# Patient Record
Sex: Female | Born: 1935 | Race: White | Hispanic: No | Marital: Single | Smoking: Never smoker
Health system: Southern US, Community
[De-identification: ages and names within clinical notes are randomized; demographics above are authoritative.]

## PROBLEM LIST (undated history)

## (undated) DIAGNOSIS — I10 Essential (primary) hypertension: Secondary | ICD-10-CM

## (undated) DIAGNOSIS — M8580 Other specified disorders of bone density and structure, unspecified site: Secondary | ICD-10-CM

## (undated) DIAGNOSIS — M898X9 Other specified disorders of bone, unspecified site: Secondary | ICD-10-CM

## (undated) DIAGNOSIS — M1990 Unspecified osteoarthritis, unspecified site: Secondary | ICD-10-CM

## (undated) HISTORY — PX: EYE SURGERY: SHX253

## (undated) HISTORY — PX: TONSILLECTOMY: SUR1361

## (undated) HISTORY — DX: Other specified disorders of bone, unspecified site: M89.8X9

## (undated) HISTORY — DX: Other specified disorders of bone density and structure, unspecified site: M85.80

---

## 2004-07-11 ENCOUNTER — Ambulatory Visit: Payer: Self-pay | Admitting: Internal Medicine

## 2005-09-28 ENCOUNTER — Ambulatory Visit: Payer: Self-pay | Admitting: Gastroenterology

## 2005-11-21 ENCOUNTER — Ambulatory Visit: Payer: Self-pay | Admitting: Internal Medicine

## 2006-11-27 ENCOUNTER — Ambulatory Visit: Payer: Self-pay | Admitting: Internal Medicine

## 2007-06-09 ENCOUNTER — Ambulatory Visit: Payer: Self-pay | Admitting: Physician Assistant

## 2007-08-06 ENCOUNTER — Ambulatory Visit: Payer: Self-pay | Admitting: Gastroenterology

## 2008-08-15 ENCOUNTER — Emergency Department: Payer: Self-pay | Admitting: Emergency Medicine

## 2008-12-21 ENCOUNTER — Ambulatory Visit: Payer: Self-pay | Admitting: Gastroenterology

## 2009-03-22 ENCOUNTER — Encounter: Payer: Self-pay | Admitting: Internal Medicine

## 2009-03-24 ENCOUNTER — Ambulatory Visit: Payer: Self-pay | Admitting: Internal Medicine

## 2009-04-13 ENCOUNTER — Encounter: Payer: Self-pay | Admitting: Internal Medicine

## 2009-05-14 ENCOUNTER — Encounter: Payer: Self-pay | Admitting: Internal Medicine

## 2010-04-27 ENCOUNTER — Ambulatory Visit: Payer: Self-pay | Admitting: Internal Medicine

## 2010-05-09 ENCOUNTER — Encounter: Payer: Self-pay | Admitting: Sports Medicine

## 2010-05-14 ENCOUNTER — Encounter: Payer: Self-pay | Admitting: Sports Medicine

## 2010-06-14 ENCOUNTER — Encounter: Payer: Self-pay | Admitting: Sports Medicine

## 2011-02-09 ENCOUNTER — Ambulatory Visit: Payer: Self-pay | Admitting: Ophthalmology

## 2011-02-19 ENCOUNTER — Ambulatory Visit: Payer: Self-pay | Admitting: Ophthalmology

## 2011-06-22 ENCOUNTER — Ambulatory Visit: Payer: Self-pay | Admitting: Gastroenterology

## 2013-03-14 IMAGING — RF DG UGI W/ KUB
3 series · 14 of 24 positions shown · non-contrast
Comparison: none

REASON FOR EXAM: with 12 mm tablet  unspecified dysphagia
gastroesophageal reflux
COMMENTS:

PROCEDURE:     FL  - FL UPPER GI W/ BARIUM SWALLOW  - June 22, 2011  [DATE]
RESULT:     Comparison: None.
TECHNIQUE: Standard double contrast upper GI and barium swallow was
performed with monitoring by intermittent fluoroscopy.

[Series 1: fluoro_barium 2fps_bw · 0.17mm/px · 2 of 15 frames shown (1 of 3)]
[frame 3/15]
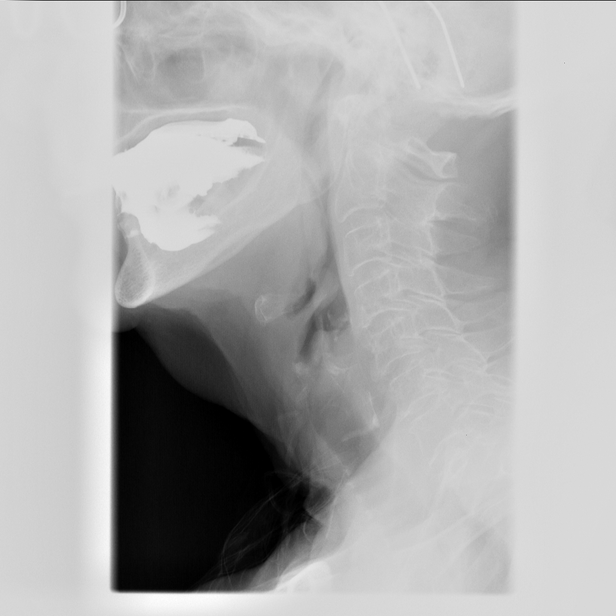
[frame 10/15]
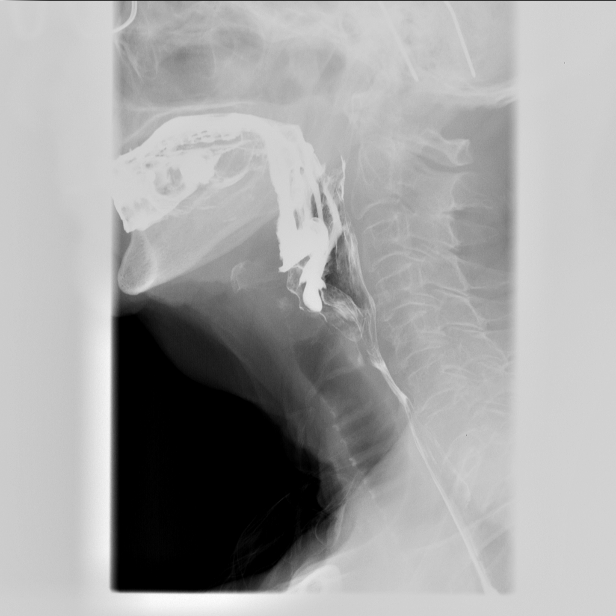

[Series 2: fluoro_barium 2fps_bw · 0.17mm/px · 2 of 10 frames shown (2 of 3)]
[frame 2/10]
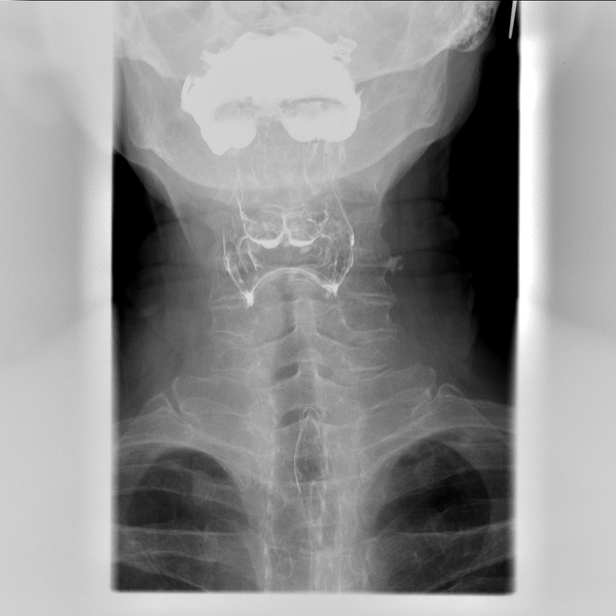
[frame 9/10]
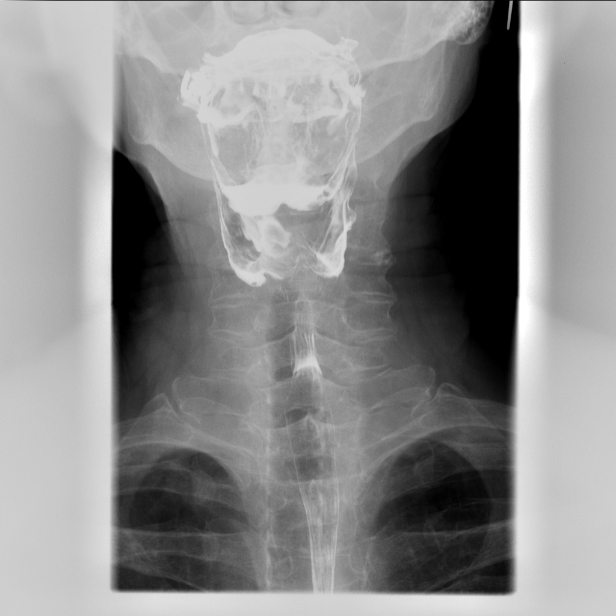

[Series 3: fluoro_barium 2fps_bw · 0.17mm/px · 10 of 18 slices shown (3 of 3)]
[im 1/18]
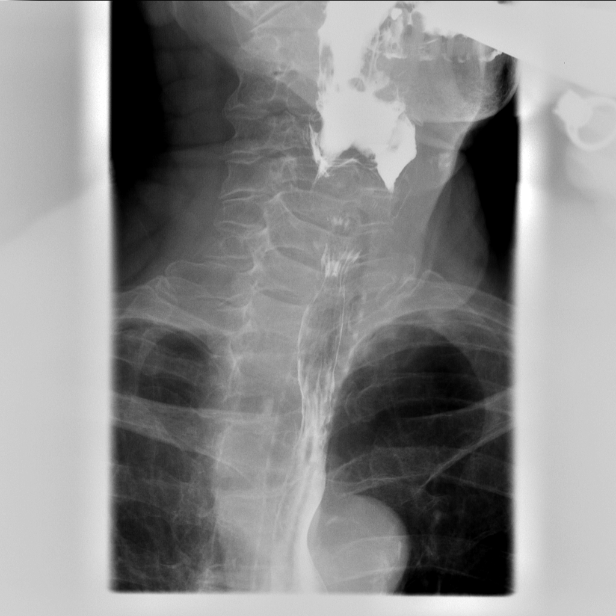
[im 3/18]
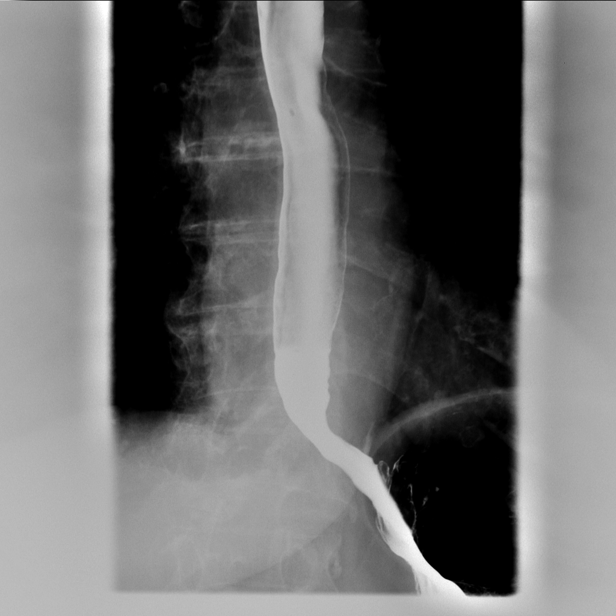
[im 5/18]
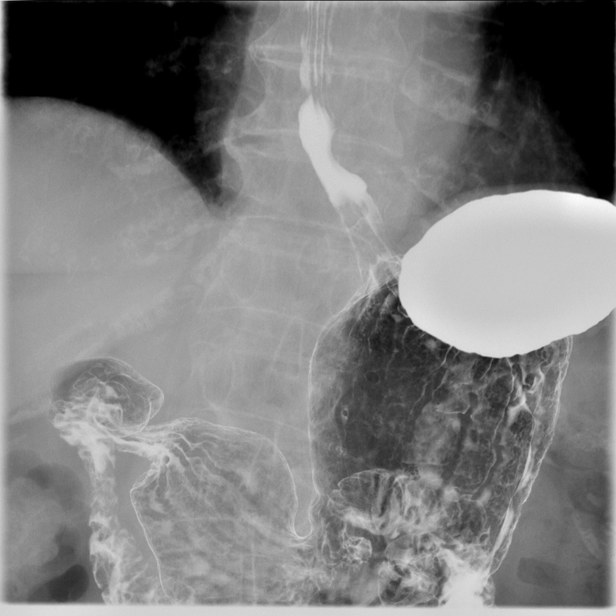
[im 6/18]
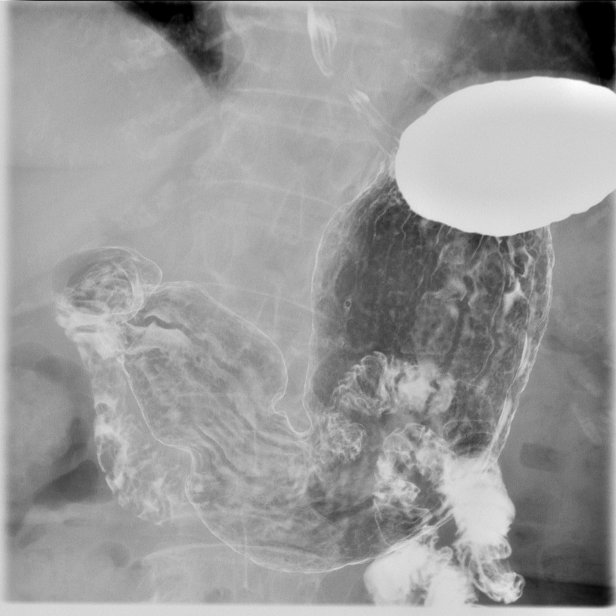
[im 8/18]
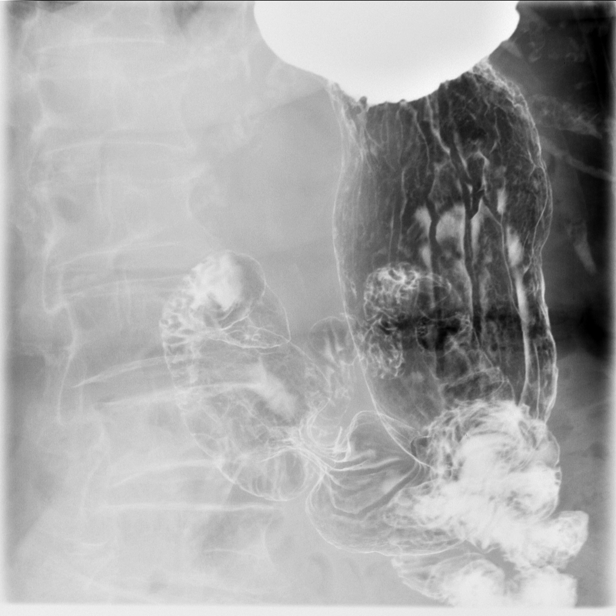
[im 10/18]
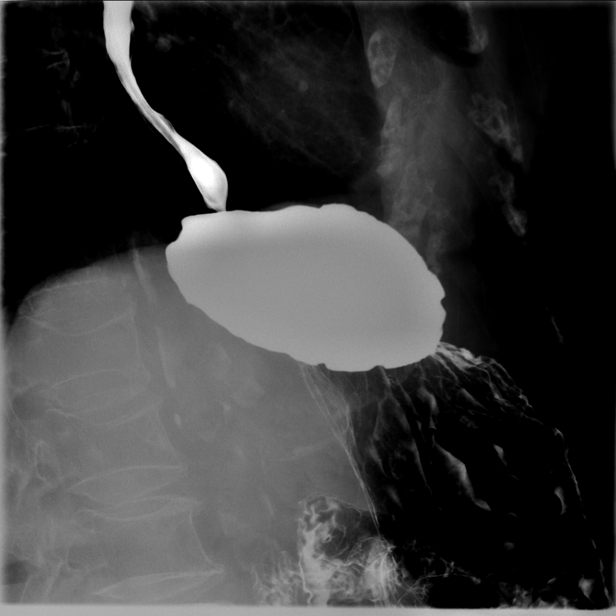
[im 13/18]
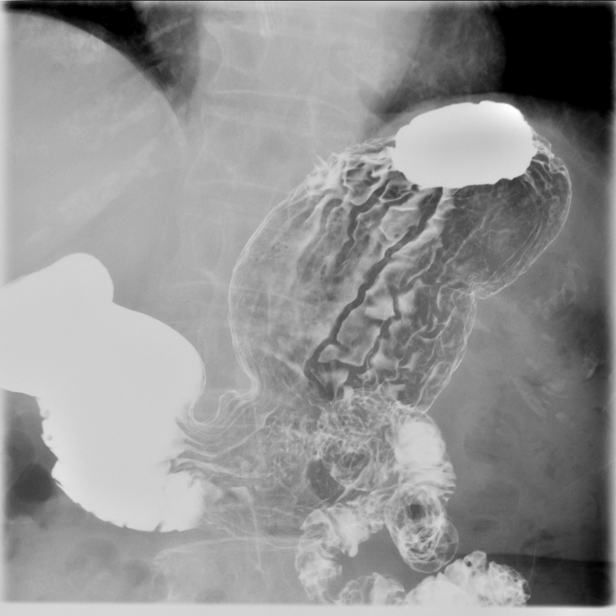
[im 14/18]
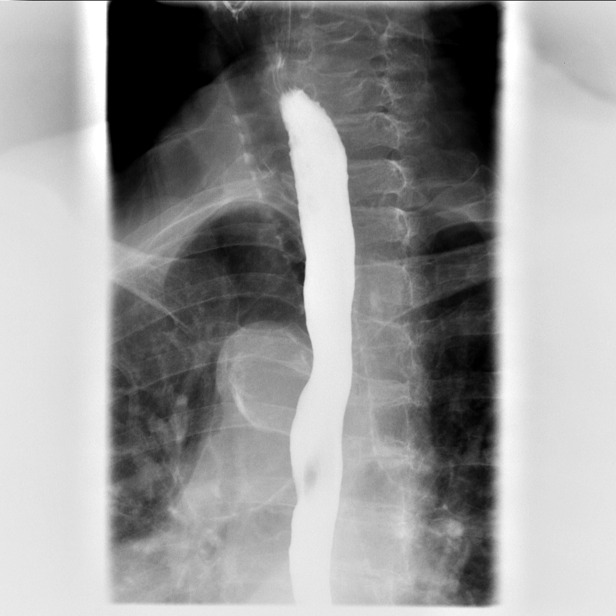
[im 16/18]
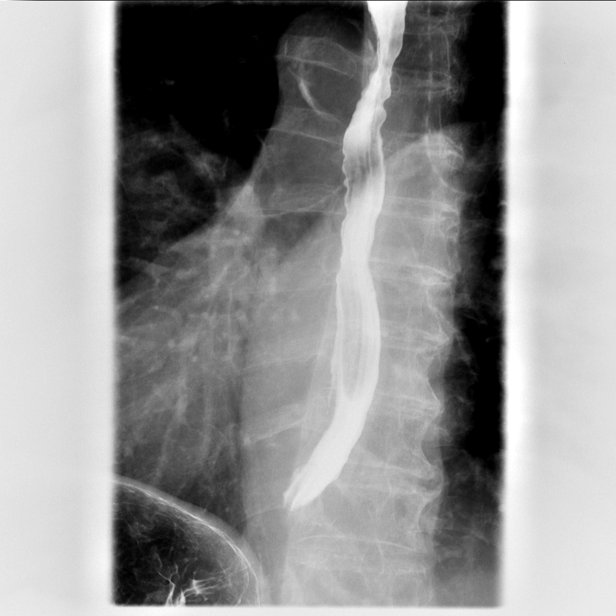
[im 18/18]
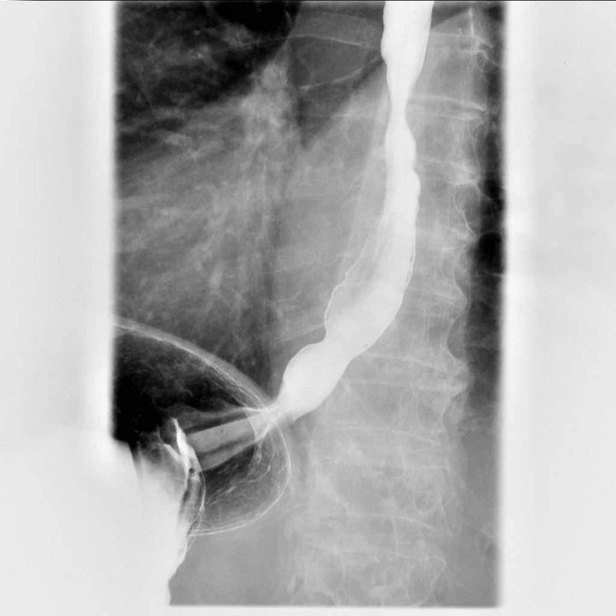

[14 of 24 positions shown; findings below may reference images not displayed]

FINDINGS: There is normal passage of barium contrast material through the hypopharynx
and into the cervical esophagus. No penetration or aspiration.

The thoracic esophagus was normal in caliber. No stricture or intraluminal
filling defect identified. There was an intermittent small sliding-type
hiatal hernia. There was mild gastroesophageal reflux into the distal
esophagus during examination. Gastroesophageal junction was normal in
caliber. There is a break in the primary peristaltic wave with a few
secondary contractions.

The stomach was normal in contour. No ulceration or intraluminal filling
defects identified.

The patient swallowed a 13 mm barium tablet which passed freely into the
stomach.
IMPRESSION: 1. Small, intermittent sliding type hiatal hernia.
2. Mild gastroesophageal reflux.

## 2013-06-17 ENCOUNTER — Encounter: Payer: Self-pay | Admitting: Podiatry

## 2013-06-17 ENCOUNTER — Ambulatory Visit (INDEPENDENT_AMBULATORY_CARE_PROVIDER_SITE_OTHER): Payer: Medicare Other | Admitting: Podiatry

## 2013-06-17 ENCOUNTER — Ambulatory Visit (INDEPENDENT_AMBULATORY_CARE_PROVIDER_SITE_OTHER): Payer: Medicare Other

## 2013-06-17 VITALS — BP 146/64 | HR 70 | Resp 16 | Ht 64.0 in | Wt 130.0 lb

## 2013-06-17 DIAGNOSIS — M79673 Pain in unspecified foot: Secondary | ICD-10-CM

## 2013-06-17 DIAGNOSIS — M79609 Pain in unspecified limb: Secondary | ICD-10-CM

## 2013-06-17 DIAGNOSIS — M7750 Other enthesopathy of unspecified foot: Secondary | ICD-10-CM

## 2013-06-17 DIAGNOSIS — M2040 Other hammer toe(s) (acquired), unspecified foot: Secondary | ICD-10-CM

## 2013-06-17 DIAGNOSIS — M7752 Other enthesopathy of left foot: Secondary | ICD-10-CM

## 2013-06-17 NOTE — Progress Notes (Signed)
   Subjective:    Patient ID: Erika Daniel, female    DOB: 1921/12/28, 80 y.o.   MRN: [id number]  HPI Comments: i have a corn on my little toe on my left foot and it does not hurt. It will hurt with shoes on and ive had it for 1.5 to 2.0 years. Its grown some over the years and i dont do anything for it.   Foot Pain      Review of Systems  All other systems reviewed and are negative.       Objective:   Physical Exam: I have reviewed her past mental history medications allergies surgeries and social history. Vital signs are stable she is alert and oriented x3. Pulses are palpable left foot. Neurologic sensorium is intact left foot. She has hammertoe deformities 2 through 5 of the left foot. Overlying bursitis PIPJ fifth digit left foot with a reactive hyperkeratosis and a porokeratotic lesion. This is demonstrated by radiographs also.          Assessment & Plan:  Assessment: Hammertoe deformity fifth left with overlying bursitis and reactive hyperkeratosis.  Plan: Injected dexamethasone and local anesthetic sublesionally today into the bursa and about the capsule. 2 mg was utilized. I debrided the reactive hyperkeratosis placed a Band-Aid she'll start soaking in Epsom salts warm water she we'll have her shoes by stretch we discussed this in detail today and I will followup with her on an as-needed basis.

## 2014-03-22 DIAGNOSIS — Z8739 Personal history of other diseases of the musculoskeletal system and connective tissue: Secondary | ICD-10-CM | POA: Insufficient documentation

## 2014-03-22 DIAGNOSIS — K219 Gastro-esophageal reflux disease without esophagitis: Secondary | ICD-10-CM | POA: Insufficient documentation

## 2014-03-22 DIAGNOSIS — I10 Essential (primary) hypertension: Secondary | ICD-10-CM | POA: Insufficient documentation

## 2014-03-24 DIAGNOSIS — E559 Vitamin D deficiency, unspecified: Secondary | ICD-10-CM | POA: Insufficient documentation

## 2014-05-30 ENCOUNTER — Emergency Department: Payer: Self-pay | Admitting: Emergency Medicine

## 2014-05-30 LAB — URINALYSIS, COMPLETE
BLOOD: NEGATIVE
Bilirubin,UR: NEGATIVE
Glucose,UR: NEGATIVE mg/dL (ref 0–75)
KETONE: NEGATIVE
LEUKOCYTE ESTERASE: NEGATIVE
Nitrite: NEGATIVE
Ph: 7 (ref 4.5–8.0)
Protein: NEGATIVE
Specific Gravity: 1.004 (ref 1.003–1.030)
Squamous Epithelial: <1
WBC UR: 6 /HPF (ref 0–5)

## 2014-05-30 LAB — BASIC METABOLIC PANEL
ANION GAP: 7 (ref 7–16)
BUN: 17 mg/dL (ref 7–18)
CO2: 29 mmol/L (ref 21–32)
Calcium, Total: 8.7 mg/dL (ref 8.5–10.1)
Chloride: 103 mmol/L (ref 98–107)
Creatinine: 1.08 mg/dL (ref 0.60–1.30)
EGFR (African American): >60
EGFR (Non-African Amer.): 51 — ABNORMAL LOW
Glucose: 82 mg/dL (ref 65–99)
OSMOLALITY: 278 (ref 275–301)
Potassium: 4.5 mmol/L (ref 3.5–5.1)
Sodium: 139 mmol/L (ref 136–145)

## 2014-05-30 LAB — CBC
HCT: 41.2 % (ref 35.0–47.0)
HGB: 13.5 g/dL (ref 12.0–16.0)
MCH: 30.6 pg (ref 26.0–34.0)
MCHC: 32.9 g/dL (ref 32.0–36.0)
MCV: 93 fL (ref 80–100)
Platelet: 260 10*3/uL (ref 150–440)
RBC: 4.42 10*6/uL (ref 3.80–5.20)
RDW: 13.7 % (ref 11.5–14.5)
WBC: 6.3 10*3/uL (ref 3.6–11.0)

## 2014-05-30 LAB — TROPONIN I

## 2014-06-08 ENCOUNTER — Encounter: Payer: Self-pay | Admitting: Family Medicine

## 2014-06-14 ENCOUNTER — Encounter: Payer: Self-pay | Admitting: Family Medicine

## 2014-07-13 ENCOUNTER — Encounter: Payer: Self-pay | Admitting: Family Medicine

## 2015-01-07 ENCOUNTER — Emergency Department: Payer: Medicare Other

## 2015-01-07 ENCOUNTER — Emergency Department
Admission: EM | Admit: 2015-01-07 | Discharge: 2015-01-07 | Disposition: A | Discharge status: Home / Self Care | Payer: Medicare Other | Attending: Emergency Medicine | Admitting: Emergency Medicine

## 2015-01-07 DIAGNOSIS — R001 Bradycardia, unspecified: Secondary | ICD-10-CM | POA: Diagnosis not present

## 2015-01-07 DIAGNOSIS — R531 Weakness: Secondary | ICD-10-CM | POA: Diagnosis not present

## 2015-01-07 LAB — COMPREHENSIVE METABOLIC PANEL
ALK PHOS: 86 U/L (ref 38–126)
ALT: 43 U/L (ref 14–54)
AST: 82 U/L — AB (ref 15–41)
Albumin: 3.9 g/dL (ref 3.5–5.0)
Anion gap: 8 (ref 5–15)
BILIRUBIN TOTAL: 1.5 mg/dL — AB (ref 0.3–1.2)
BUN: 12 mg/dL (ref 6–20)
CO2: 25 mmol/L (ref 22–32)
CREATININE: 0.93 mg/dL (ref 0.44–1.00)
Calcium: 8.9 mg/dL (ref 8.9–10.3)
Chloride: 104 mmol/L (ref 101–111)
GFR calc Af Amer: >60 mL/min (ref >60)
GFR, EST NON AFRICAN AMERICAN: 52 mL/min — AB (ref >60)
GLUCOSE: 128 mg/dL — AB (ref 65–99)
Potassium: 4 mmol/L (ref 3.5–5.1)
Sodium: 137 mmol/L (ref 135–145)
TOTAL PROTEIN: 6.3 g/dL — AB (ref 6.5–8.1)

## 2015-01-07 LAB — TROPONIN I: Troponin I: <0.03 ng/mL (ref <0.031)

## 2015-01-07 LAB — CBC
HEMATOCRIT: 40.4 % (ref 35.0–47.0)
HEMOGLOBIN: 13.5 g/dL (ref 12.0–16.0)
MCH: 30.6 pg (ref 26.0–34.0)
MCHC: 33.5 g/dL (ref 32.0–36.0)
MCV: 91.4 fL (ref 80.0–100.0)
PLATELETS: 253 10*3/uL (ref 150–440)
RBC: 4.41 MIL/uL (ref 3.80–5.20)
RDW: 13.3 % (ref 11.5–14.5)
WBC: 8.4 10*3/uL (ref 3.6–11.0)

## 2015-01-07 MED ORDER — METOPROLOL TARTRATE 25 MG PO TABS: 25.0000 mg | ORAL_TABLET | Freq: Two times a day (BID) | ORAL | Status: DC

## 2015-01-07 NOTE — ED Provider Notes (Signed)
[facility] Emergency Department Provider Note     Time seen: ----------------------------------------- 1:27 PM on 01/07/2015 -----------------------------------------    I have reviewed the triage vital signs and the nursing notes.   HISTORY  Chief Complaint No chief complaint on file.    HPI Erika Daniel is a 81 y.o. female who presents to ER after having a sudden onset of weakness today. Patient states she felt lifeless, on EMS arrival she was noted to be bradycardic, family had reported a low blood pressure at home. EMS found a normal blood pressure. Patient does not describe any change in her medications or the possibility that she could've taken too much for medicine. Patient states she had a virus over the weekend but otherwise has not been ill recently. Currently feeling better, she received half liter of saline in route.   Past Medical History  Diagnosis Date  . Bone loss     There are no active problems to display for this patient.   Past Surgical History  Procedure Laterality Date  . Eye surgery Right     implant  . Tonsillectomy      Allergies Codeine  Social History Social History  Substance Use Topics  . Smoking status: Never Smoker   . Smokeless tobacco: Not on file  . Alcohol Use: No    Review of Systems Constitutional: Negative for fever. Eyes: Negative for visual changes. ENT: Negative for sore throat. Cardiovascular: Negative for chest pain. Respiratory: Negative for shortness of breath. Gastrointestinal: Negative for abdominal pain, vomiting and diarrhea. Genitourinary: Negative for dysuria. Musculoskeletal: Negative for back pain. Skin: Negative for rash. Neurological: Positive for weakness  10-point ROS otherwise negative.  ____________________________________________   PHYSICAL EXAM:  VITAL SIGNS: ED Triage Vitals  Enc Vitals Group     BP --      Pulse --      Resp --      Temp --      Temp  src --      SpO2 --      Weight --      Height --      Head Cir --      Peak Flow --      Pain Score --      Pain Loc --      Pain Edu? --      Excl. in [place]? --     Constitutional: Alert and oriented. Well appearing and in no distress. Eyes: Conjunctivae are normal. PERRL. Normal extraocular movements. ENT   Head: Normocephalic and atraumatic.   Nose: No congestion/rhinnorhea.   Mouth/Throat: Mucous membranes are moist.   Neck: No stridor. Cardiovascular: Normal rate, regular rhythm. Normal and symmetric distal pulses are present in all extremities. No murmurs, rubs, or gallops. Respiratory: Normal respiratory effort without tachypnea nor retractions. Breath sounds are clear and equal bilaterally. No wheezes/rales/rhonchi. Gastrointestinal: Soft and nontender. No distention. No abdominal bruits.  Musculoskeletal: Nontender with normal range of motion in all extremities. No joint effusions.  No lower extremity tenderness nor edema. Neurologic:  Normal speech and language. No gross focal neurologic deficits are appreciated. Speech is normal. No gait instability. Skin:  Skin is warm, dry and intact. No rash noted. Psychiatric: Mood and affect are normal. Speech and behavior are normal. Patient exhibits appropriate insight and judgment. ____________________________________________  EKG: Interpreted by me. Sinus bradycardia with first-degree AV block, normal QS with, normal QT interval. Normal axis.  ____________________________________________  ED COURSE:  Pertinent labs &  imaging results that were available during my care of the patient were reviewed by me and considered in my medical decision making (see chart for details). Patient will need cardiac labs, EKG and reevaluation. ____________________________________________    LABS (pertinent positives/negatives)  Labs Reviewed  COMPREHENSIVE METABOLIC PANEL - Abnormal; Notable for the following:    Glucose, Bld 128 (*)     Total Protein 6.3 (*)    AST 82 (*)    Total Bilirubin 1.5 (*)    GFR calc non Af Amer 52 (*)    All other components within normal limits  CBC  TROPONIN I  URINALYSIS COMPLETEWITH MICROSCOPIC ([facility] ONLY)    RADIOLOGY Images were viewed by me  chest x-ray IMPRESSION: No acute cardiopulmonary disease. ____________________________________________  FINAL ASSESSMENT AND PLAN  Weakness, bradycardia  Plan: Patient with labs and imaging as dictated above. Patient does not was stay in the hospital. Attempted to discuss with her primary care doctor however was unable. We will shorten her metoprolol dose to half and have her follow-up in 2 days for recheck. She is encouraged to return for worsening or worrisome symptoms.   Earleen Newport, MD   Earleen Newport, MD 01/07/15 [phone]

## 2015-01-07 NOTE — ED Notes (Signed)
Ems from home for sudden onset weakness

## 2015-01-07 NOTE — Discharge Instructions (Signed)
Bradycardia Bradycardia is a term for a heart rate (pulse) that, in adults, is slower than 60 beats per minute. A normal rate is 60 to 100 beats per minute. A heart rate below 60 beats per minute may be normal for some adults with healthy hearts. If the rate is too slow, the heart may have trouble pumping the volume of blood the body needs. If the heart rate gets too low, blood flow to the brain may be decreased and may make you feel lightheaded, dizzy, or faint. The heart has a natural pacemaker in the top of the heart called the SA node (sinoatrial or sinus node). This pacemaker sends out regular electrical signals to the muscle of the heart, telling the heart muscle when to beat (contract). The electrical signal travels from the upper parts of the heart (atria) through the AV node (atrioventricular node), to the lower chambers of the heart (ventricles). The ventricles squeeze, pumping the blood from your heart to your lungs and to the rest of your body. CAUSES   Problem with the heart's electrical system.  Problem with the heart's natural pacemaker.  Heart disease, damage, or infection.  Medications.  Problems with minerals and salts (electrolytes). SYMPTOMS   Fainting (syncope).  Fatigue and weakness.  Shortness of breath (dyspnea).  Chest pain (angina).  Drowsiness.  Confusion. DIAGNOSIS   An electrocardiogram (ECG) can help your caregiver determine the type of slow heart rate you have.  If the cause is not seen on an ECG, you may need to wear a heart monitor that records your heart rhythm for several hours or days.  Blood tests. TREATMENT   Electrolyte supplements.  Medications.  Withholding medication which is causing a slow heart rate.  Pacemaker placement. SEEK IMMEDIATE MEDICAL CARE IF:   You feel lightheaded or faint.  You develop an irregular heart rate.  You feel chest pain or have trouble breathing. MAKE SURE YOU:   Understand these  instructions.  Will watch your condition.  Will get help right away if you are not doing well or get worse. Document Released: 01/20/2002 Document Revised: 07/23/2011 Document Reviewed: 08/05/2013 [facility] Patient Information 2015 [place], [place]. This information is not intended to replace advice given to you by your health care provider. Make sure you discuss any questions you have with your health care provider.  Weakness Weakness is a lack of strength. It may be felt all over the body (generalized) or in one specific part of the body (focal). Some causes of weakness can be serious. You may need further medical evaluation, especially if you are elderly or you have a history of immunosuppression (such as chemotherapy or HIV), kidney disease, heart disease, or diabetes. CAUSES  Weakness can be caused by many different things, including:  Infection.  Physical exhaustion.  Internal bleeding or other blood loss that results in a lack of red blood cells (anemia).  Dehydration. This cause is more common in elderly people.  Side effects or electrolyte abnormalities from medicines, such as pain medicines or sedatives.  Emotional distress, anxiety, or depression.  Circulation problems, especially severe peripheral arterial disease.  Heart disease, such as rapid atrial fibrillation, bradycardia, or heart failure.  Nervous system disorders, such as Guillain-Barr syndrome, multiple sclerosis, or stroke. DIAGNOSIS  To find the cause of your weakness, your caregiver will take your history and perform a physical exam. Lab tests or X-rays may also be ordered, if needed. TREATMENT  Treatment of weakness depends on the cause of your symptoms and  can vary greatly. HOME CARE INSTRUCTIONS   Rest as needed.  Eat a well-balanced diet.  Try to get some exercise every day.  Only take over-the-counter or prescription medicines as directed by your caregiver. SEEK MEDICAL CARE IF:   Your weakness  seems to be getting worse or spreads to other parts of your body.  You develop new aches or pains. SEEK IMMEDIATE MEDICAL CARE IF:   You cannot perform your normal daily activities, such as getting dressed and feeding yourself.  You cannot walk up and down stairs, or you feel exhausted when you do so.  You have shortness of breath or chest pain.  You have difficulty moving parts of your body.  You have weakness in only one area of the body or on only one side of the body.  You have a fever.  You have trouble speaking or swallowing.  You cannot control your bladder or bowel movements.  You have black or bloody vomit or stools. MAKE SURE YOU:  Understand these instructions.  Will watch your condition.  Will get help right away if you are not doing well or get worse. Document Released: 04/30/2005 Document Revised: 10/30/2011 Document Reviewed: 06/29/2011 [facility] Patient Information 2015 [place], [place]. This information is not intended to replace advice given to you by your health care provider. Make sure you discuss any questions you have with your health care provider.

## 2016-02-20 IMAGING — CR DG CHEST 1V PORT
1 series · 1 of 1 positions shown · non-contrast
Comparison: None.

CLINICAL DATA: First degree heart block.  Weakness and dizziness.

EXAM:
PORTABLE CHEST - 1 VIEW

[ap]
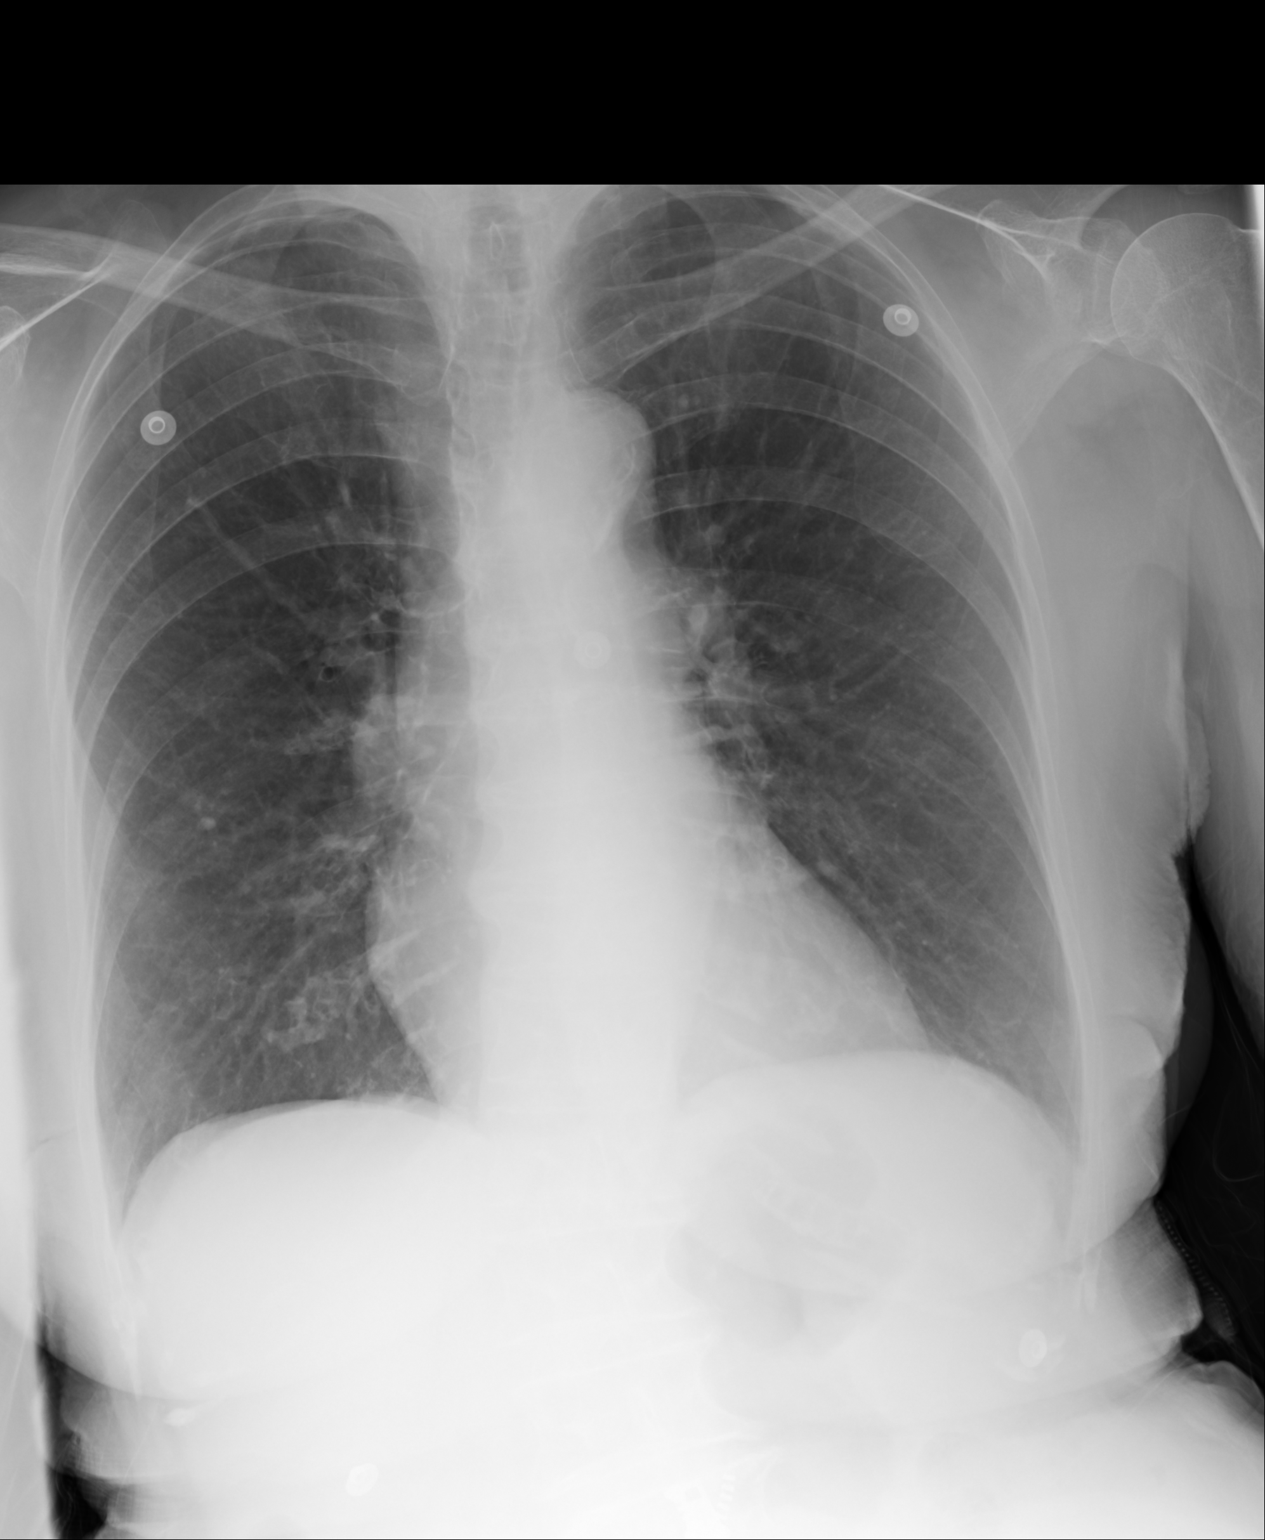

[1 of 1 positions shown; findings below may reference images not displayed]

FINDINGS: Atherosclerotic calcification in the aortic arch. No significant
pulmonary parenchymal abnormality observed. No pleural effusion or
edema.

Thoracic spondylosis noted.
IMPRESSION: 1. Atherosclerotic aortic arch. Thoracic spondylosis. No acute
findings observed.

## 2016-04-12 DIAGNOSIS — Z0000 Encounter for general adult medical examination without abnormal findings: Secondary | ICD-10-CM | POA: Insufficient documentation

## 2016-09-29 IMAGING — CR DG CHEST 1V PORT
1 series · 1 of 1 positions shown · non-contrast
Comparison: 05/30/2014

CLINICAL DATA: Fall this morning. Some shortness of breath. History
of hypertension.

EXAM:
PORTABLE CHEST - 1 VIEW

[ap]
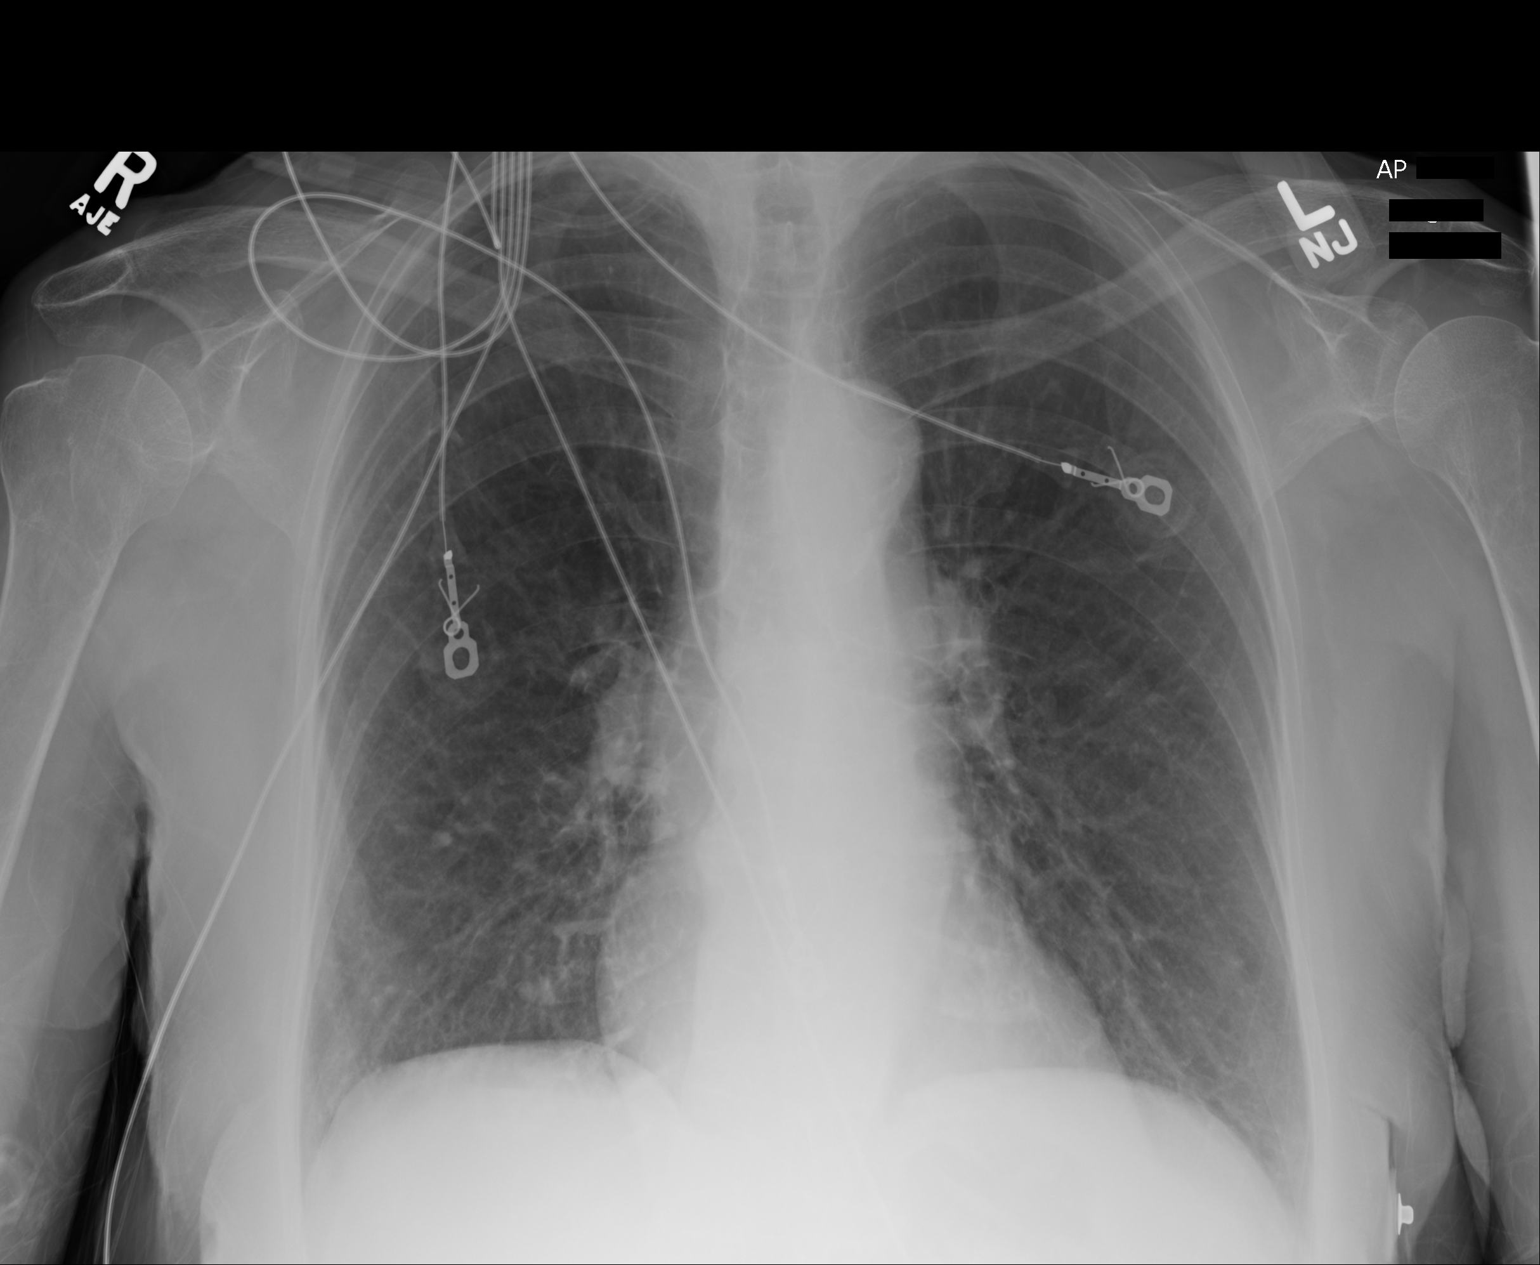

[1 of 1 positions shown; findings below may reference images not displayed]

FINDINGS: Cardiac silhouette is normal in size and configuration. No
mediastinal or hilar masses or evidence of adenopathy.

Lungs are mildly hyperexpanded. There is mild thickening of the
interstitial markings most evident in the lung bases. No lung
consolidation or edema. No pleural effusion or pneumothorax.

Bony thorax is intact.
IMPRESSION: No acute cardiopulmonary disease.

## 2016-12-31 DIAGNOSIS — C4491 Basal cell carcinoma of skin, unspecified: Secondary | ICD-10-CM

## 2016-12-31 HISTORY — DX: Basal cell carcinoma of skin, unspecified: C44.91

## 2017-09-22 ENCOUNTER — Encounter: Payer: Self-pay | Admitting: Emergency Medicine

## 2017-09-22 ENCOUNTER — Emergency Department: Payer: Medicare Other

## 2017-09-22 ENCOUNTER — Emergency Department
Admission: EM | Admit: 2017-09-22 | Discharge: 2017-09-22 | Disposition: A | Discharge status: Home / Self Care | Payer: Medicare Other | Attending: Emergency Medicine | Admitting: Emergency Medicine

## 2017-09-22 DIAGNOSIS — Y999 Unspecified external cause status: Secondary | ICD-10-CM | POA: Insufficient documentation

## 2017-09-22 DIAGNOSIS — Y9301 Activity, walking, marching and hiking: Secondary | ICD-10-CM | POA: Insufficient documentation

## 2017-09-22 DIAGNOSIS — Z79899 Other long term (current) drug therapy: Secondary | ICD-10-CM | POA: Diagnosis not present

## 2017-09-22 DIAGNOSIS — Z7982 Long term (current) use of aspirin: Secondary | ICD-10-CM | POA: Diagnosis not present

## 2017-09-22 DIAGNOSIS — W0110XA Fall on same level from slipping, tripping and stumbling with subsequent striking against unspecified object, initial encounter: Secondary | ICD-10-CM | POA: Diagnosis not present

## 2017-09-22 DIAGNOSIS — I10 Essential (primary) hypertension: Secondary | ICD-10-CM | POA: Insufficient documentation

## 2017-09-22 DIAGNOSIS — S22080A Wedge compression fracture of T11-T12 vertebra, initial encounter for closed fracture: Secondary | ICD-10-CM | POA: Diagnosis not present

## 2017-09-22 DIAGNOSIS — W19XXXA Unspecified fall, initial encounter: Secondary | ICD-10-CM

## 2017-09-22 DIAGNOSIS — S3992XA Unspecified injury of lower back, initial encounter: Secondary | ICD-10-CM | POA: Diagnosis present

## 2017-09-22 DIAGNOSIS — Y92003 Bedroom of unspecified non-institutional (private) residence as the place of occurrence of the external cause: Secondary | ICD-10-CM | POA: Diagnosis not present

## 2017-09-22 DIAGNOSIS — Y92009 Unspecified place in unspecified non-institutional (private) residence as the place of occurrence of the external cause: Secondary | ICD-10-CM

## 2017-09-22 HISTORY — DX: Essential (primary) hypertension: I10

## 2017-09-22 HISTORY — DX: Unspecified osteoarthritis, unspecified site: M19.90

## 2017-09-22 MED ORDER — MELOXICAM 7.5 MG PO TABS
7.5000 mg | ORAL_TABLET | Freq: Once | ORAL | Status: AC
  Administered 2017-09-22: 7.5 mg via ORAL
  Filled 2017-09-22: qty 1

## 2017-09-22 MED ORDER — MELOXICAM 7.5 MG PO TABS: 7.5000 mg | ORAL_TABLET | Freq: Every day | ORAL | 0 refills | Status: AC

## 2017-09-22 NOTE — Discharge Instructions (Addendum)
Follow-up with Dr. Netty Starring.  Call tomorrow to make an appointment.  Take 1 Tylenol every 6-8 hours as needed for pain.  Meloxicam 7.5mg  1 daily.  Call the office to let them know that you are on this medication also since you are taking Coumadin.  Ice or heat to your back as needed for comfort.  Compression fractures can cause pain for 4 to 6 weeks.

## 2017-09-22 NOTE — ED Triage Notes (Signed)
Pt states that she got out of bed to use the bathroom and when she came back to the bed to get in she fell. Pt c/o pain to mid back. Pt denies LOC, denies dizziness.

## 2017-09-22 NOTE — ED Notes (Signed)
See triage note  States she tripped  Having pain to mid back

## 2017-09-22 NOTE — ED Provider Notes (Signed)
[facility] Emergency Department Provider Note   ____________________________________________   First MD Initiated Contact with Patient 09/22/17 [phone]     (approximate)  I have reviewed the triage vital signs and the nursing notes.   HISTORY  Chief Complaint Fall and Back Pain  HPI Erika Daniel is a 84 y.o. female is here with complaint of back pain.  Patient states that she got out of the bed to go to the bathroom this morning and fell.  She states that she fell on a carpeted floor and more or less slid out of the bed without actual falling.  She denies any head injury or loss of consciousness.  Daughter states that she has had multiple episodes where she just sits down on the floor.  Daughter and patient are unaware of any prior back injuries.  They are aware that she has arthritis and does have a history of dropfoot.  Currently she rates her pain as 10/10.  Patient also has not taken her blood pressure medication prior to arrival.   Past Medical History:  Diagnosis Date  . Bone loss   . Hypertension   . Osteoarthrosis     There are no active problems to display for this patient.   Prior to Admission medications   Medication Sig Start Date End Date Taking? Authorizing Provider  aspirin 81 MG tablet Take 81 mg by mouth daily.    [provider]  calcitonin, salmon, (MIACALCIN/FORTICAL) 200 UNIT/ACT nasal spray Place 1 spray into alternate nostrils daily.    [provider]  Calcium Carb-Cholecalciferol (CALCIUM 600 + D PO) Take by mouth daily.    [provider]  meloxicam (MOBIC) 7.5 MG tablet Take 1 tablet (7.5 mg total) by mouth daily. 09/22/17 09/22/18  Johnn Hai, PA-C  metoprolol tartrate (LOPRESSOR) 25 MG tablet Take 1 tablet (25 mg total) by mouth 2 (two) times daily. 01/07/15 01/07/16  Earleen Newport, MD  valsartan-hydrochlorothiazide (DIOVAN-HCT) 160-12.5 MG per tablet Take 1 tablet by mouth daily.     [provider]  verapamil (COVERA HS) 240 MG (CO) 24 hr tablet Take 240 mg by mouth daily.    [provider]    Allergies Codeine  No family history on file.  Social History Social History   Tobacco Use  . Smoking status: Never Smoker  Substance Use Topics  . Alcohol use: No  . Drug use: Never    Review of Systems Constitutional: No fever/chills Eyes: No visual changes. ENT: No trauma. Cardiovascular: Denies chest pain. Respiratory: Denies shortness of breath. Gastrointestinal:  No nausea, no vomiting.  Genitourinary: Negative for dysuria. Musculoskeletal: Positive for back pain.  Negative for pain buttocks. Skin: Negative for rash. Neurological: Negative for headaches, focal weakness or numbness. ___________________________________________   PHYSICAL EXAM:  VITAL SIGNS: ED Triage Vitals  Enc Vitals Group     BP 09/22/17 0840 (!) 195/82     Pulse Rate 09/22/17 0840 69     Resp 09/22/17 0840 20     Temp 09/22/17 0840 97.7 F (36.5 C)     Temp Source 09/22/17 0840 Oral     SpO2 09/22/17 0840 97 %     Weight 09/22/17 0841 135 lb (61.2 kg)     Height 09/22/17 0841 5\' 1"  (1.549 m)     Head Circumference --      Peak Flow --      Pain Score 09/22/17 0840 10     Pain Loc --  Pain Edu? --      Excl. in [place]? --    Constitutional: Alert and oriented. Well appearing and in no acute distress. Eyes: Conjunctivae are normal. PERRL. EOMI. Head: Atraumatic. Nose: No trauma. Neck: No stridor.  Nontender cervical spine to palpation posteriorly. Cardiovascular: Normal rate, regular rhythm. Grossly normal heart sounds.  Good peripheral circulation. Respiratory: Normal respiratory effort.  No retractions. Lungs CTAB. Gastrointestinal: Soft and nontender. No distention.  Musculoskeletal: On examination of the back there is no gross deformity and minimal tenderness on palpation of the vertebral bodies posteriorly.  No ecchymosis or abrasions were seen.   Patient has limited range of motion as this increases her pain. Neurologic:  Normal speech and language. No gross focal neurologic deficits are appreciated.  Skin:  Skin is warm, dry and intact.  No ecchymosis or abrasions were seen. Psychiatric: Mood and affect are normal. Speech and behavior are normal.  ____________________________________________   LABS (all labs ordered are listed, but only abnormal results are displayed)  Labs Reviewed - No data to display  RADIOLOGY  ED MD interpretation:   Thoracic x-ray is questionable for compression fracture T12.  Moderate degenerative changes. X-ray pelvis is negative for fracture but moderate degenerative changes are noted.  Official radiology report(s): Dg Thoracic Spine 2 View  Result Date: 09/22/2017 CLINICAL DATA:  84 year old female with a history of fall EXAM: THORACIC SPINE 2 VIEWS COMPARISON:  01/07/2015, 05/30/2014 FINDINGS: Osteopenia. Loss of vertebral body heights of the T12 level. No retropulsion of fracture fragment. No fracture line identified. No comparison is available. Multilevel degenerative changes. IMPRESSION: Loss of vertebral body height of T12, may indicate compression fracture. No comparison is available. If there is point tenderness at the T12 level, further evaluation with MRI may be useful. Degenerative changes and osteopenia. Electronically Signed   By: Corrie Mckusick D.O.   On: 09/22/2017 09:25   Dg Pelvis 1-2 Views  Result Date: 09/22/2017 CLINICAL DATA:  [place] from bed this morning, posterior pelvic pain EXAM: PELVIS - 1-2 VIEW COMPARISON:  None FINDINGS: Diffuse osseous demineralization. Hip and SI joint spaces preserved. Degenerative disc disease changes at visualized lower lumbar spine. No acute fracture, dislocation, or bone destruction. IMPRESSION: Osseous demineralization. No acute pelvic abnormalities. Degenerative disc disease changes lower lumbar spine. Electronically Signed   By: Lavonia Dana M.D.   On:  09/22/2017 09:29    ____________________________________________   PROCEDURES  Procedure(s) performed: None  Procedures  Critical Care performed: No  ____________________________________________   INITIAL IMPRESSION / ASSESSMENT AND PLAN / ED COURSE  As part of my medical decision making, I reviewed the following data within the electronic MEDICAL RECORD NUMBER Notes from prior ED visits and [place] Controlled Substance Database  Daughter states that in the past patient has taken meloxicam for pain.  We discussed use of narcotics for her age may cause increased risk for falling.  Office notes were accessed and patient has been on meloxicam 15 mg after a sprain to her ankle without any GI complications.  Patient was given a prescription for meloxicam 7.5 mg with one in the emergency department.  They are to contact Dr. Netty Starring if any further pain medication is needed.  At this time it is uncertain whether her compression fracture today is acute or chronic.  Patient is not tender to palpation in this region.  MRI imaging may be necessary if patient continues with pain. ____________________________________________   FINAL CLINICAL IMPRESSION(S) / ED DIAGNOSES  Final diagnoses:  Fall in home,  initial encounter  Compression fracture of T12 vertebra [facility])     ED Discharge Orders        Ordered    meloxicam (MOBIC) 7.5 MG tablet  Daily     09/22/17 1040       Note:  This document was prepared using Dragon voice recognition software and may include unintentional dictation errors.    Johnn Hai, PA-C 09/22/17 1304    Harvest Dark, MD 09/22/17 1349

## 2018-11-09 ENCOUNTER — Emergency Department: Payer: Medicare Other

## 2018-11-09 ENCOUNTER — Other Ambulatory Visit: Payer: Self-pay

## 2018-11-09 ENCOUNTER — Emergency Department
Admission: EM | Admit: 2018-11-09 | Discharge: 2018-11-10 | Disposition: A | Discharge status: Home / Self Care | Payer: Medicare Other | Attending: Emergency Medicine | Admitting: Emergency Medicine

## 2018-11-09 DIAGNOSIS — E871 Hypo-osmolality and hyponatremia: Secondary | ICD-10-CM | POA: Diagnosis not present

## 2018-11-09 DIAGNOSIS — Z79899 Other long term (current) drug therapy: Secondary | ICD-10-CM | POA: Insufficient documentation

## 2018-11-09 DIAGNOSIS — Z7982 Long term (current) use of aspirin: Secondary | ICD-10-CM | POA: Insufficient documentation

## 2018-11-09 DIAGNOSIS — I10 Essential (primary) hypertension: Secondary | ICD-10-CM | POA: Insufficient documentation

## 2018-11-09 DIAGNOSIS — R42 Dizziness and giddiness: Secondary | ICD-10-CM

## 2018-11-09 LAB — CBC WITH DIFFERENTIAL/PLATELET
Abs Immature Granulocytes: 0.05 10*3/uL (ref 0.00–0.07)
Basophils Absolute: 0 10*3/uL (ref 0.0–0.1)
Basophils Relative: 1 %
Eosinophils Absolute: 0.1 10*3/uL (ref 0.0–0.5)
Eosinophils Relative: 1 %
HCT: 39.6 % (ref 36.0–46.0)
Hemoglobin: 13.9 g/dL (ref 12.0–15.0)
Immature Granulocytes: 1 %
Lymphocytes Relative: 18 %
Lymphs Abs: 1.4 10*3/uL (ref 0.7–4.0)
MCH: 30.9 pg (ref 26.0–34.0)
MCHC: 35.1 g/dL (ref 30.0–36.0)
MCV: 88 fL (ref 80.0–100.0)
Monocytes Absolute: 0.9 10*3/uL (ref 0.1–1.0)
Monocytes Relative: 12 %
Neutro Abs: 5.2 10*3/uL (ref 1.7–7.7)
Neutrophils Relative %: 67 %
Platelets: 359 10*3/uL (ref 150–400)
RBC: 4.5 MIL/uL (ref 3.87–5.11)
RDW: 12.3 % (ref 11.5–15.5)
WBC: 7.7 10*3/uL (ref 4.0–10.5)
nRBC: 0 % (ref 0.0–0.2)

## 2018-11-09 LAB — BASIC METABOLIC PANEL
Anion gap: 9 (ref 5–15)
BUN: 14 mg/dL (ref 8–23)
CO2: 26 mmol/L (ref 22–32)
Calcium: 8.8 mg/dL — ABNORMAL LOW (ref 8.9–10.3)
Chloride: 91 mmol/L — ABNORMAL LOW (ref 98–111)
Creatinine, Ser: 0.72 mg/dL (ref 0.44–1.00)
GFR calc Af Amer: >60 mL/min (ref >60)
GFR calc non Af Amer: >60 mL/min (ref >60)
Glucose, Bld: 102 mg/dL — ABNORMAL HIGH (ref 70–99)
Potassium: 3.6 mmol/L (ref 3.5–5.1)
Sodium: 126 mmol/L — ABNORMAL LOW (ref 135–145)

## 2018-11-09 LAB — TROPONIN I (HIGH SENSITIVITY): Troponin I (High Sensitivity): 4 ng/L (ref <18)

## 2018-11-09 MED ORDER — SODIUM CHLORIDE 0.9 % IV BOLUS
500.0000 mL | Freq: Once | INTRAVENOUS | Status: AC
  Administered 2018-11-09: 500 mL via INTRAVENOUS

## 2018-11-09 NOTE — ED Notes (Signed)
Up dated patient's family member that we are still awaiting test results.

## 2018-11-09 NOTE — Discharge Instructions (Addendum)
Please follow-up with your doctor this week for continued monitoring of your symptoms.  Your sodium level returned to normal after IV fluids.  Your other blood tests were reassuring.

## 2018-11-09 NOTE — ED Provider Notes (Signed)
[facility] Emergency Department Provider Note ____________________________________________   First MD Initiated Contact with Patient 11/09/18 2003     (approximate)  I have reviewed the triage vital signs and the nursing notes.   HISTORY  Chief Complaint Hypertension and Dizziness    HPI Erika Daniel is a 85 y.o. female with PMH as noted below who presents with dizziness, acute onset around 3 PM today and now improved, and associated with a mild frontal headache.  She states that she felt off balance but did not specifically feel the room spinning.  She also states that there is a component of lightheadedness.  She states that she takes medication for blood pressure but did not miss any doses.  She denies nausea or vomiting, chest pain, difficulty breathing, or weakness.  Past Medical History:  Diagnosis Date  . Bone loss   . Hypertension   . Osteoarthrosis     There are no active problems to display for this patient.   Past Surgical History:  Procedure Laterality Date  . EYE SURGERY Right    implant  . TONSILLECTOMY      Prior to Admission medications   Medication Sig Start Date End Date Taking? Authorizing Provider  acetaminophen (TYLENOL) 500 MG tablet Take 500 mg by mouth every 6 (six) hours as needed for mild pain or headache.   Yes [provider]  aspirin EC 81 MG tablet Take 81 mg by mouth daily.   Yes [provider]  Biotin 5000 MCG TABS Take 5,000 mcg by mouth daily.   Yes [provider]  losartan (COZAAR) 50 MG tablet Take 50 mg by mouth daily. 10/25/18  Yes [provider]  Multiple Vitamin (MULTIVITAMIN WITH MINERALS) TABS tablet Take 1 tablet by mouth daily.   Yes [provider]  pantoprazole (PROTONIX) 40 MG tablet Take 40 mg by mouth daily. 09/06/18  Yes [provider]  verapamil (CALAN-SR) 240 MG CR tablet Take 240 mg by mouth daily. 10/25/18  Yes [provider]     Allergies Codeine  History reviewed. No pertinent family history.  Social History Social History   Tobacco Use  . Smoking status: Never Smoker  Substance Use Topics  . Alcohol use: No  . Drug use: Never    Review of Systems  Constitutional: No fever. Eyes: No visual changes. ENT: No neck pain. Cardiovascular: Denies chest pain. Respiratory: Denies shortness of breath. Gastrointestinal: No nausea or vomiting. Genitourinary: Negative for dysuria.  Musculoskeletal: Negative for back pain. Skin: Negative for rash. Neurological: Positive for headache.   ____________________________________________   PHYSICAL EXAM:  VITAL SIGNS: ED Triage Vitals  Enc Vitals Group     BP 11/09/18 1915 (!) 226/102     Pulse Rate 11/09/18 1915 90     Resp 11/09/18 1915 16     Temp 11/09/18 1915 98.2 F (36.8 C)     Temp Source 11/09/18 1915 Oral     SpO2 11/09/18 1915 95 %     Weight 11/09/18 1916 130 lb (59 kg)     Height 11/09/18 1916 5\' 3"  (1.6 m)     Head Circumference --      Peak Flow --      Pain Score 11/09/18 1916 0     Pain Loc --      Pain Edu? --      Excl. in [place]? --     Constitutional: Alert and oriented. Well appearing for age and in no  acute distress. Eyes: Conjunctivae are normal.  EOMI.  PERRLA. Head: Atraumatic. Nose: No congestion/rhinnorhea. Mouth/Throat: Mucous membranes are moist.   Neck: Normal range of motion.  No meningeal signs. Cardiovascular: Normal rate, regular rhythm. Good peripheral circulation. Respiratory: Normal respiratory effort.  No retractions.  Gastrointestinal: No distention.  Musculoskeletal: No lower extremity edema.  Extremities warm and well perfused.  Neurologic:  Normal speech and language.  Motor and sensory intact in all extremities.  Normal coordination with no ataxia on finger-to-nose.  No pronator drift.  No gross focal neurologic deficits are appreciated.  Skin:  Skin is warm and dry. No rash noted. Psychiatric: Mood  and affect are normal. Speech and behavior are normal.  ____________________________________________   LABS (all labs ordered are listed, but only abnormal results are displayed)  Labs Reviewed  BASIC METABOLIC PANEL - Abnormal; Notable for the following components:      Result Value   Sodium 126 (*)    Chloride 91 (*)    Glucose, Bld 102 (*)    Calcium 8.8 (*)    All other components within normal limits  CBC WITH DIFFERENTIAL/PLATELET  TROPONIN I (HIGH SENSITIVITY)  TROPONIN I (HIGH SENSITIVITY)   ____________________________________________  EKG  ED ECG REPORT I, Arta Silence, the attending physician, personally viewed and interpreted this ECG.  Date: 11/09/2018 EKG Time: 2123 Rate: 68 Rhythm: normal sinus rhythm QRS Axis: normal Intervals: normal ST/T Wave abnormalities: normal Narrative Interpretation: no evidence of acute ischemia  ____________________________________________  RADIOLOGY  CT head: No ICH or other acute abnormality  ____________________________________________   PROCEDURES  Procedure(s) performed: No  Procedures  Critical Care performed: No ____________________________________________   INITIAL IMPRESSION / ASSESSMENT AND PLAN / ED COURSE  Pertinent labs & imaging results that were available during my care of the patient were reviewed by me and considered in my medical decision making (see chart for details).  85 year old female with PMH as noted above including hypertension for which she is on verapamil who presents with an episode of dizziness with frontal headache.  Patient was noted by EMS and again when she arrived here to be significantly hypertensive.  On exam, the patient is very well-appearing for her age.  Her vital signs are normal except for elevated blood pressure.  Neurologic exam is normal.  The remainder of the exam is unremarkable.  Her EKG is nonischemic.  Differential includes peripheral vertigo, vasovagal  near syncope, or less likely acute CNS process such as ICH.  The patient has no symptoms of other acute endorgan dysfunction related to the elevated blood pressure.  We will obtain CT head, lab work-up including troponin, and reassess.  If the blood pressure remains elevated I will give antihypertensive here.  ----------------------------------------- 11:34 PM on 11/09/2018 -----------------------------------------  Blood pressure has improved substantially without intervention.  The CT head shows no acute abnormalities.  Lab work-up is unremarkable except for hyponatremia which I suspect is chronic since the last labs we have on the patient are from 3 years ago.  However, this could contribute to her dizziness.  We will give a fluid bolus and reassess.  The patient very much would like to go home so I anticipate discharge after fluids with a plan to follow-up with her PMD.  I am signing the patient out to the oncoming physician Dr. Joni Fears. ____________________________________________   FINAL CLINICAL IMPRESSION(S) / ED DIAGNOSES  Final diagnoses:  Hyponatremia  Dizziness      NEW MEDICATIONS STARTED DURING THIS VISIT:  New Prescriptions  No medications on file     Note:  This document was prepared using Dragon voice recognition software and may include unintentional dictation errors.    Arta Silence, MD 11/09/18 [phone]

## 2018-11-09 NOTE — ED Notes (Signed)
Erika Daniel [phone]- Niece and POA

## 2018-11-09 NOTE — ED Notes (Signed)
Patient moved into room 1.

## 2018-11-09 NOTE — ED Notes (Signed)
To CT via stretcher

## 2018-11-09 NOTE — ED Triage Notes (Signed)
Pt arrives [facility] from home c/o of dizziness and HA that began around lunch today. Pt states HA has eased off but dizziness remains. States that she feels off balance, not room spinning. A&O, ambulatory with 1 assist. Hx HTN. L arm BP with EMS was 270/102, R arm BP with EMS was 260/100. Other vitals stable. Has NOT missed any doses of medication. Speech clear, no tingling/numbness. No vision changes.

## 2018-11-10 LAB — URINALYSIS, ROUTINE W REFLEX MICROSCOPIC
Bilirubin Urine: NEGATIVE
Glucose, UA: NEGATIVE mg/dL
Hgb urine dipstick: NEGATIVE
Ketones, ur: NEGATIVE mg/dL
Leukocytes,Ua: NEGATIVE
Nitrite: NEGATIVE
Protein, ur: NEGATIVE mg/dL
Specific Gravity, Urine: 1.003 — ABNORMAL LOW (ref 1.005–1.030)
pH: 7 (ref 5.0–8.0)

## 2018-11-10 LAB — BASIC METABOLIC PANEL
Anion gap: 7 (ref 5–15)
BUN: 11 mg/dL (ref 8–23)
CO2: 26 mmol/L (ref 22–32)
Calcium: 8.7 mg/dL — ABNORMAL LOW (ref 8.9–10.3)
Chloride: 101 mmol/L (ref 98–111)
Creatinine, Ser: 0.54 mg/dL (ref 0.44–1.00)
GFR calc Af Amer: >60 mL/min (ref >60)
GFR calc non Af Amer: >60 mL/min (ref >60)
Glucose, Bld: 116 mg/dL — ABNORMAL HIGH (ref 70–99)
Potassium: 3.4 mmol/L — ABNORMAL LOW (ref 3.5–5.1)
Sodium: 134 mmol/L — ABNORMAL LOW (ref 135–145)

## 2018-11-10 LAB — TROPONIN I (HIGH SENSITIVITY): Troponin I (High Sensitivity): 5 ng/L (ref <18)

## 2018-11-10 MED ORDER — SODIUM CHLORIDE 0.9 % IV BOLUS
500.0000 mL | Freq: Once | INTRAVENOUS | Status: AC
  Administered 2018-11-10: 500 mL via INTRAVENOUS

## 2018-11-10 NOTE — ED Notes (Signed)
Up to bathroom with assistance.

## 2018-11-10 NOTE — ED Notes (Signed)
Patient resting quietly at this time, voices no complaints. 

## 2018-11-10 NOTE — ED Notes (Signed)
Patient up to bathroom with assistance.

## 2018-11-10 NOTE — ED Provider Notes (Signed)
-----------------------------------------   4:03 AM on 11/10/2018 -----------------------------------------   Second troponin normal.  No evidence of ACS.  Blood pressure is much improved and in a prehypertensive range at this point.  Sodium normalized after 2 small fluid boluses.  Stable for discharge home which the patient was agreeable to in discussion with Dr. Cherylann Banas.   Carrie Mew, MD 11/10/18 [phone]

## 2018-11-10 NOTE — ED Notes (Signed)
Called niece and informed her she is ready to be picked up.

## 2019-06-15 IMAGING — CR DG PELVIS 1-2V
1 series · 1 of 1 positions shown · non-contrast
Comparison: None

CLINICAL DATA: Fell from bed this morning, posterior pelvic pain

EXAM:
PELVIS - 1-2 VIEW

[pelvis ap]
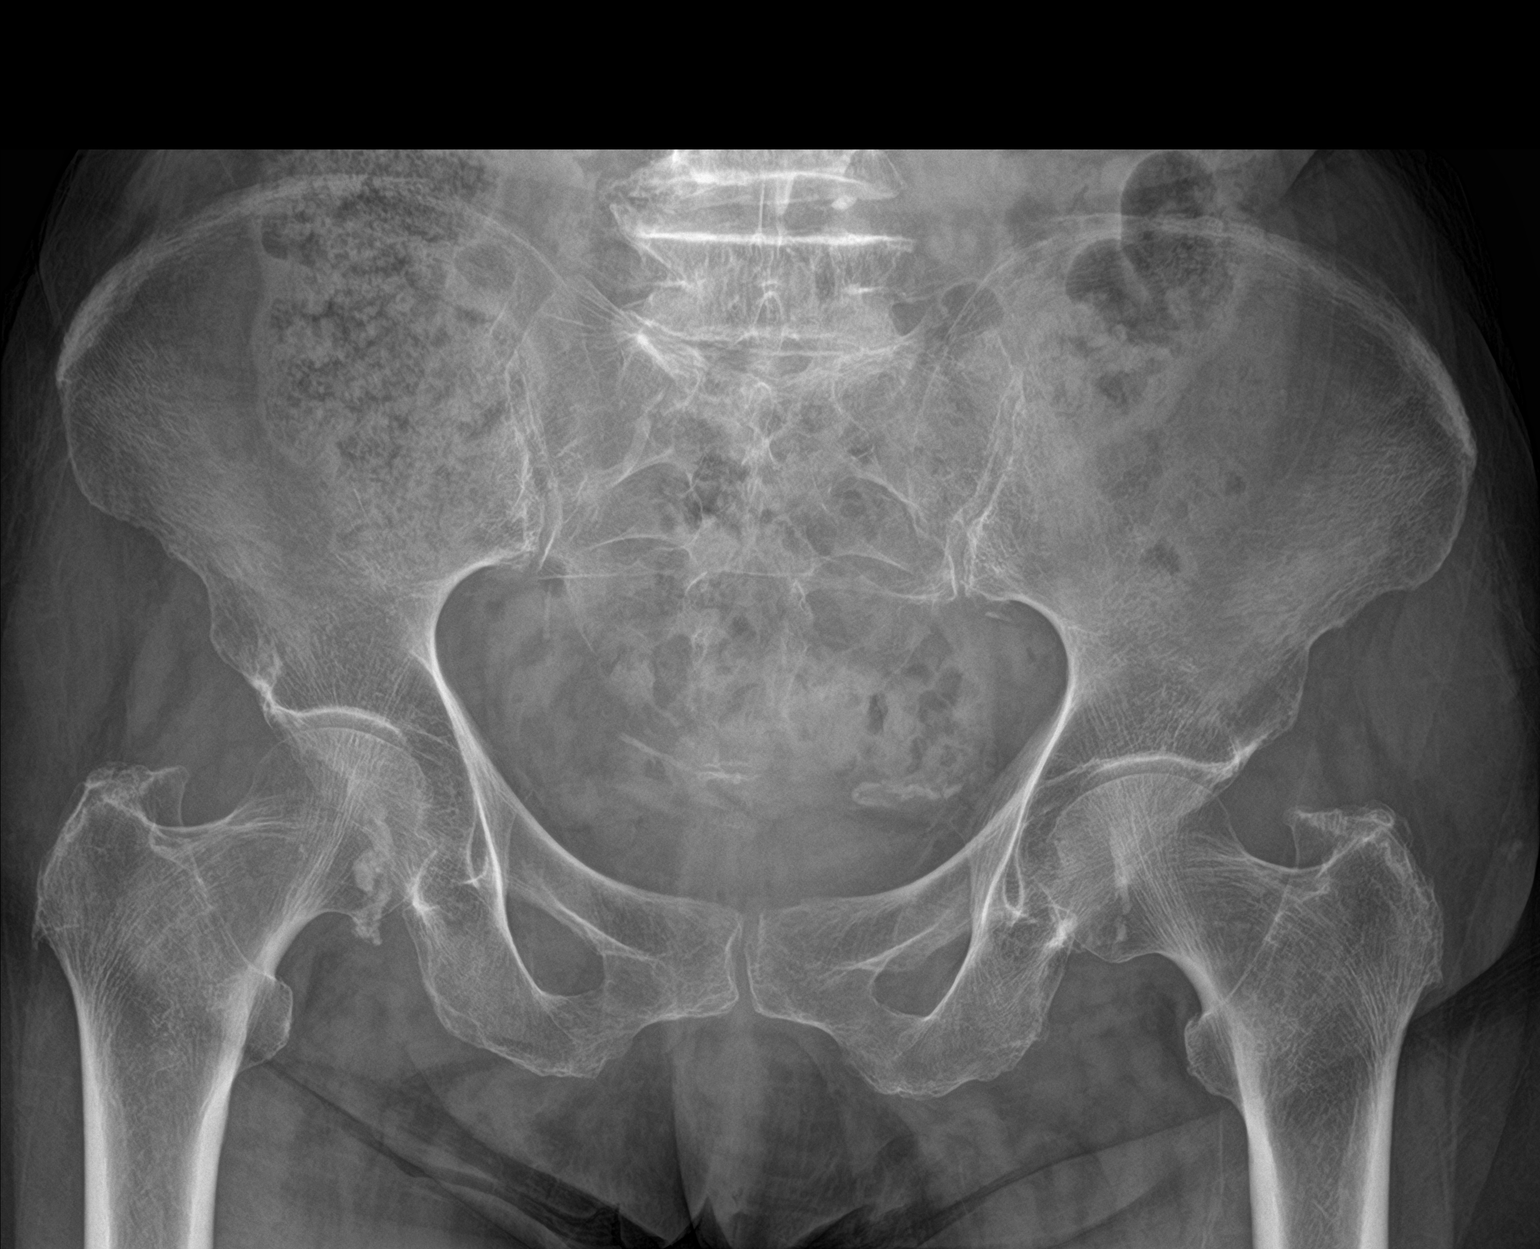

[1 of 1 positions shown; findings below may reference images not displayed]

FINDINGS: Diffuse osseous demineralization.

Hip and SI joint spaces preserved.

Degenerative disc disease changes at visualized lower lumbar spine.

No acute fracture, dislocation, or bone destruction.
IMPRESSION: Osseous demineralization.

No acute pelvic abnormalities.

Degenerative disc disease changes lower lumbar spine.

## 2019-09-02 ENCOUNTER — Ambulatory Visit: Payer: Medicare Other | Admitting: Podiatry

## 2019-09-02 ENCOUNTER — Other Ambulatory Visit: Payer: Self-pay

## 2019-09-02 ENCOUNTER — Encounter: Payer: Self-pay | Admitting: Podiatry

## 2019-09-02 DIAGNOSIS — M79676 Pain in unspecified toe(s): Secondary | ICD-10-CM | POA: Diagnosis not present

## 2019-09-02 DIAGNOSIS — B351 Tinea unguium: Secondary | ICD-10-CM

## 2019-09-02 NOTE — Progress Notes (Signed)
She presents today chief complaint of painfully elongated toenails.  Objective: Toenails are long thick yellow dystrophic-like mycotic painful palpation.  Pulses are palpable.  No open lesions or wounds are noted.  Assessment: Pain limb secondary onychomycosis bilateral  Plan: Debridement of toenails 1 through 5 bilateral.

## 2019-12-02 ENCOUNTER — Encounter: Payer: Self-pay | Admitting: Podiatry

## 2019-12-02 ENCOUNTER — Ambulatory Visit: Payer: Medicare Other | Admitting: Podiatry

## 2019-12-02 ENCOUNTER — Other Ambulatory Visit: Payer: Self-pay

## 2019-12-02 DIAGNOSIS — B351 Tinea unguium: Secondary | ICD-10-CM | POA: Diagnosis not present

## 2019-12-02 DIAGNOSIS — M79676 Pain in unspecified toe(s): Secondary | ICD-10-CM

## 2019-12-02 NOTE — Progress Notes (Signed)
She presents today for routine foot care.  Objective: Toenails are long thick yellow dystrophic-like mycotic painful palpation.  Pulses are palpable.  No open lesions or wounds are noted.  Assessment: Pain in limb secondary to onychomycosis.  Plan: Debridement of toenails 1 through 5 bilateral.

## 2020-03-07 ENCOUNTER — Other Ambulatory Visit: Payer: Self-pay

## 2020-03-07 ENCOUNTER — Encounter: Payer: Self-pay | Admitting: Podiatry

## 2020-03-07 ENCOUNTER — Ambulatory Visit (INDEPENDENT_AMBULATORY_CARE_PROVIDER_SITE_OTHER): Payer: Medicare Other

## 2020-03-07 ENCOUNTER — Ambulatory Visit: Payer: Medicare Other | Admitting: Podiatry

## 2020-03-07 DIAGNOSIS — M79676 Pain in unspecified toe(s): Secondary | ICD-10-CM

## 2020-03-07 DIAGNOSIS — S9002XA Contusion of left ankle, initial encounter: Secondary | ICD-10-CM

## 2020-03-07 DIAGNOSIS — S92302A Fracture of unspecified metatarsal bone(s), left foot, initial encounter for closed fracture: Secondary | ICD-10-CM

## 2020-03-07 DIAGNOSIS — B351 Tinea unguium: Secondary | ICD-10-CM

## 2020-03-07 NOTE — Progress Notes (Signed)
She presents today chief complaint of painfully elongated toenails.  States that a few days ago she took a fall across the garage and hit her foot as she was coming down the steps.  States his been red and swollen ever since and painful left.  Objective: Vital signs are stable she is alert oriented x3 pulses are palpable bilateral no lacerations no open lesions or wounds.  She does have pain on palpation of the dorsal aspect of the foot around the metatarsophalangeal joints.  Moderate edema in that foot and ankle.  No calf pain.  Radiographs taken today demonstrate transverse fractures of the metatarsal necks 2 through 5 left foot.  Nondisplaced noncomminuted.  Assessment: Pain in limb secondary to onychomycosis and fractured metatarsals lesser 2 through 5 left.  Plan: At this point I will place her in a Darco shoe suggested that she use her walker at home and debrided toenails bilaterally.  Follow-up with her in about 4 weeks just for another set of x-rays

## 2020-03-09 ENCOUNTER — Encounter: Payer: Self-pay | Admitting: Dermatology

## 2020-03-09 ENCOUNTER — Other Ambulatory Visit: Payer: Self-pay

## 2020-03-09 ENCOUNTER — Ambulatory Visit: Payer: Medicare Other | Admitting: Dermatology

## 2020-03-09 DIAGNOSIS — L821 Other seborrheic keratosis: Secondary | ICD-10-CM

## 2020-03-09 DIAGNOSIS — L820 Inflamed seborrheic keratosis: Secondary | ICD-10-CM

## 2020-03-09 DIAGNOSIS — C44319 Basal cell carcinoma of skin of other parts of face: Secondary | ICD-10-CM | POA: Diagnosis not present

## 2020-03-09 NOTE — Patient Instructions (Signed)
Cryotherapy Aftercare  . Wash gently with soap and water everyday.   Marland Kitchen Apply Vaseline and Band-Aid daily until healed.  Prior to procedure, discussed risks of blister formation, small wound, skin dyspigmentation, or rare scar following cryotherapy.     Pre-Operative Instructions  You are scheduled for a surgical procedure at [facility]. We recommend you read the following instructions. If you have any questions or concerns, please call the office at [phone].  1. Shower and wash the entire body with soap and water the day of your surgery paying special attention to cleansing at and around the planned surgery site.  2. Avoid aspirin or aspirin containing products at least fourteen (14) days prior to your surgical procedure and for at least one week (7 Days) after your surgical procedure. If you take aspirin on a regular basis for heart disease or history of stroke or for any other reason, we may recommend you continue taking aspirin but please notify us if you take this on a regular basis. Aspirin can cause more bleeding to occur during surgery as well as prolonged bleeding and bruising after surgery.   3. Avoid other nonsteroidal pain medications at least one week prior to surgery and at least one week prior to your surgery. These include medications such as Ibuprofen (Motrin, Advil and Nuprin), Naprosyn, Voltaren, Relafen, etc. If medications are used for therapeutic reasons, please inform us as they can cause increased bleeding or prolonged bleeding during and bruising after surgical procedures.   4. Please advise [place] if you are taking any "blood thinner" medications such as Coumadin or Dipyridamole or Plavix or similar medications. These cause increased bleeding and prolonged bleeding during procedures and bruising after surgical procedures. We may have to consider discontinuing these medications briefly prior to and shortly after your surgery if safe to do so.   5. Please inform  us of all medications you are currently taking. All medications that are taken regularly should be taken the day of surgery as you always do. Nevertheless, we need to be informed of what medications you are taking prior to surgery to know whether they will affect the procedure or cause any complications.   6. Please inform us of any medication allergies. Also inform us of whether you have allergies to Latex or rubber products or whether you have had any adverse reaction to Lidocaine or Epinephrine.  7. Please inform us of any prosthetic or artificial body parts such as artificial heart valve, joint replacements, etc., or similar condition that might require preoperative antibiotics.   8. We recommend avoidance of alcohol at least two weeks prior to surgery and continued avoidance for at least two weeks after surgery.   9. We recommend discontinuation of tobacco smoking at least two weeks prior to surgery and continued abstinence for at least two weeks after surgery.  10. Do not plan strenuous exercise, strenuous work or strenuous lifting for approximately four weeks after your surgery.   11. We request if you are unable to make your scheduled surgical appointment, please call us at least a week in advance or as soon as you are aware of a problem so that we can cancel or reschedule the appointment.   12. You MAY TAKE TYLENOL (acetaminophen) for pain as it is not a blood thinner.   13. PLEASE PLAN TO BE IN TOWN FOR TWO WEEKS FOLLOWING SURGERY, THIS IS IMPORTANT SO YOU CAN BE CHECKED FOR DRESSING CHANGES, SUTURE REMOVAL AND TO MONITOR FOR POSSIBLE COMPLICATIONS.

## 2020-03-09 NOTE — Progress Notes (Signed)
   Follow-Up Visit   Subjective  Erika Daniel is a 86 y.o. female who presents for the following: lesion (Left forehead. Dur: couple of months. Itches. Hx of BCC at area in 2019. ) and Skin Problem (Dark spots under right breast. Rubbed, irritated by clothing.).    The following portions of the chart were reviewed this encounter and updated as appropriate:     Review of Systems: No other skin or systemic complaints except as noted in HPI or Assessment and Plan.  Objective  Well appearing patient in no apparent distress; mood and affect are within normal limits.  A focused examination was performed including face, arms, chest. Relevant physical exam findings are noted in the Assessment and Plan.  Objective  Right Inframammary x5 (5): Erythematous keratotic or waxy stuck-on papule or plaque.   Objective  Left lateral forehead above eyebrow: Pink pearly plaque. 1.3 x 1.0cm.  Area bx'd and treated with [facility] 12/31/2017  Assessment & Plan  Inflamed seborrheic keratosis (5) Right Inframammary x5  Destruction of lesion - Right Inframammary x5  Destruction method: cryotherapy   Informed consent: discussed and consent obtained   Lesion destroyed using liquid nitrogen: Yes   Region frozen until ice ball extended beyond lesion: Yes   Outcome: patient tolerated procedure well with no complications   Post-procedure details: wound care instructions given    Basal cell carcinoma (BCC) of skin of other part of face Left lateral forehead above eyebrow  Recurrent BCC. Bx/EDC 8/19  Discussed options of excision here vs Mohs in [place] or [facility] (high 90s cure rate). Patient prefers excision locally in this office.   RTC for excision.  Seborrheic Keratoses - Stuck-on, waxy, tan-brown papules and plaques  - Discussed benign etiology and prognosis. - Observe - Call for any changes    Return for excision on forehead, 04/11/2020 at 3:30.   I, Emelia Salisbury, CMA, am acting as scribe  for Brendolyn Patty, MD.  Documentation: I have reviewed the above documentation for accuracy and completeness, and I agree with the above.  Brendolyn Patty MD

## 2020-04-11 ENCOUNTER — Encounter: Payer: Medicare Other | Admitting: Dermatology

## 2020-04-11 ENCOUNTER — Ambulatory Visit: Payer: Medicare Other | Admitting: Dermatology

## 2020-04-11 ENCOUNTER — Other Ambulatory Visit: Payer: Self-pay

## 2020-04-11 DIAGNOSIS — C44319 Basal cell carcinoma of skin of other parts of face: Secondary | ICD-10-CM | POA: Diagnosis not present

## 2020-04-11 NOTE — Progress Notes (Signed)
   Follow-Up Visit   Subjective  Erika Daniel is a 86 y.o. female who presents for the following: Recurrent BCC (left lateral forehead above brow, excision today).   The following portions of the chart were reviewed this encounter and updated as appropriate:      Review of Systems:  No other skin or systemic complaints except as noted in HPI or Assessment and Plan.  Objective  Well appearing patient in no apparent distress; mood and affect are within normal limits.  A focused examination was performed including face. Relevant physical exam findings are noted in the Assessment and Plan.  Objective  Left lateral forehead above brow: Pink pearly plaque, 1.1 x 1.4cm   Assessment & Plan  Basal cell carcinoma (BCC) of skin of other part of face Left lateral forehead above brow  Skin excision  Lesion length (cm):  1.1 Lesion width (cm):  1.4 Margin per side (cm):  0.2 Total excision diameter (cm):  1.8 Informed consent: discussed and consent obtained   Timeout: patient name, date of birth, surgical site, and procedure verified   Procedure prep:  Patient was prepped and draped in usual sterile fashion Prep type:  Povidone-iodine Anesthesia: the lesion was anesthetized in a standard fashion   Anesthetic:  1% lidocaine w/ epinephrine 1-100,000 buffered w/ 8.4% NaHCO3 (cc) Instrument used comment:  #15c Hemostasis achieved with: pressure and electrodesiccation   Outcome: patient tolerated procedure well with no complications   Additional details:  Tag superior medial 12:00 tip  Skin repair Complexity:  Complex Final length (cm):  4.8 Informed consent: discussed and consent obtained   Reason for type of repair: reduce tension to allow closure, reduce the risk of dehiscence, infection, and necrosis, reduce subcutaneous dead space and avoid a hematoma, allow closure of the large defect and preserve normal anatomical and functional relationships   Undermining: area extensively  undermined   Undermining comment:  1.8 cm Subcutaneous layers (deep stitches):  Suture size:  5-0 Suture type: Vicryl (polyglactin 910)   Stitches:  Buried vertical mattress Fine/surface layer approximation (top stitches):  Suture size:  5-0 Suture type: nylon   Suture type comment:  Nylon Stitches: simple running   Suture removal (days):  7 Hemostasis achieved with: suture and pressure Outcome: patient tolerated procedure well with no complications   Post-procedure details: sterile dressing applied and wound care instructions given   Dressing type: pressure dressing (mupirocin)    Specimen 1 - Surgical pathology Differential Diagnosis: Recurrent BCC vs other Check Margins: Yes Pink pearly plaque 1.1 x 1.4cm [id number] Tag superior medial 12:00 tip  Return in about 1 week (around 04/18/2020) for suture removal.  I, Jamesetta Orleans, CMA, am acting as scribe for Brendolyn Patty, MD .  Documentation: I have reviewed the above documentation for accuracy and completeness, and I agree with the above.  Brendolyn Patty MD

## 2020-04-11 NOTE — Patient Instructions (Signed)

## 2020-04-12 ENCOUNTER — Telehealth: Payer: Self-pay

## 2020-04-12 NOTE — Telephone Encounter (Signed)
Talked to patient is she is doing fine from surgery yesterday.

## 2020-04-18 ENCOUNTER — Other Ambulatory Visit: Payer: Self-pay

## 2020-04-18 ENCOUNTER — Ambulatory Visit (INDEPENDENT_AMBULATORY_CARE_PROVIDER_SITE_OTHER): Payer: Medicare Other

## 2020-04-18 ENCOUNTER — Encounter: Payer: Self-pay | Admitting: Podiatry

## 2020-04-18 ENCOUNTER — Ambulatory Visit (INDEPENDENT_AMBULATORY_CARE_PROVIDER_SITE_OTHER): Payer: Medicare Other | Admitting: Dermatology

## 2020-04-18 ENCOUNTER — Ambulatory Visit: Payer: Medicare Other | Admitting: Podiatry

## 2020-04-18 DIAGNOSIS — C44319 Basal cell carcinoma of skin of other parts of face: Secondary | ICD-10-CM

## 2020-04-18 DIAGNOSIS — S92302D Fracture of unspecified metatarsal bone(s), left foot, subsequent encounter for fracture with routine healing: Secondary | ICD-10-CM

## 2020-04-18 DIAGNOSIS — Z4802 Encounter for removal of sutures: Secondary | ICD-10-CM

## 2020-04-18 NOTE — Progress Notes (Signed)
   Follow-Up Visit   Subjective  Erika Daniel is a 86 y.o. female who presents for the following: Follow-up.   The following portions of the chart were reviewed this encounter and updated as appropriate:      Review of Systems:  No other skin or systemic complaints except as noted in HPI or Assessment and Plan.  Objective  Well appearing patient in no apparent distress; mood and affect are within normal limits.  A focused examination was performed including face. Relevant physical exam findings are noted in the Assessment and Plan.  Objective  Left Forehead above brow: Excision site healing well, no evidence of infection       Assessment & Plan  Basal cell carcinoma (BCC) of skin of other part of face Left Forehead above brow  BCC- margins free  Wound cleansed, sutures removed, wound cleansed and steri strips applied. Discussed pathology results.   Return in about 6 months (around 10/17/2020) for Hx BCC.   IJamesetta Orleans, CMA, am acting as scribe for Brendolyn Patty, MD .  Documentation: I have reviewed the above documentation for accuracy and completeness, and I agree with the above.  Brendolyn Patty MD

## 2020-04-18 NOTE — Progress Notes (Signed)
She presents today for follow-up of her lesser metatarsal fractures as she has fractured metatarsals #2 #3 #4 #5 of the left foot.  She states that it feels much better she continues to wear her short Darco shoe on a regular basis..  Objective: Vital signs are stable alert oriented x3 there is no erythema edema cellulitis drainage or odor much decrease in edema no tenderness on palpation of the lesser metatarsal necks.  Radiographs taken today do demonstrate callus development along the neck of each fracture site.  There is some mild deviation of the fracture sites but no more so than previously noted.  Assessment: Slowly resolving fractured lesser metatarsal necks #2 #3 #4 #5 of the left foot.  Plan: Continue the use of the Darco shoe for the next 4 to 5 weeks we will follow-up with her at that time for another set of x-rays.

## 2020-05-18 ENCOUNTER — Ambulatory Visit: Payer: Medicare Other | Admitting: Podiatry

## 2020-05-18 ENCOUNTER — Ambulatory Visit (INDEPENDENT_AMBULATORY_CARE_PROVIDER_SITE_OTHER): Payer: Medicare Other

## 2020-05-18 ENCOUNTER — Other Ambulatory Visit: Payer: Self-pay

## 2020-05-18 ENCOUNTER — Encounter: Payer: Self-pay | Admitting: Podiatry

## 2020-05-18 DIAGNOSIS — S92302D Fracture of unspecified metatarsal bone(s), left foot, subsequent encounter for fracture with routine healing: Secondary | ICD-10-CM

## 2020-05-18 NOTE — Progress Notes (Signed)
She presents today for follow-up of her lesser metatarsal neck fractures left foot.  She states that she is doing almost 100% better she states that she would like to try to get out of this shoe she is afraid she is going to fall in the Darco shoe.  Objective: Vital signs are stable she is alert and oriented x3.  Pulses are palpable.  Still has some ecchymosis across the dorsum of the foot but the swelling has now gone down considerably and the range of motion of the lesser metatarsophalangeal joints with no pain is very encouraging.  Radiographs taken today demonstrate healing fractures with some lateral dislocation of the heads of the lesser metatarsals no comminution.  Assessment: Well-healing fracture metatarsals left foot.  Plan: Follow-up with me on as-needed basis.

## 2020-06-14 DIAGNOSIS — N183 Chronic kidney disease, stage 3 unspecified: Secondary | ICD-10-CM | POA: Insufficient documentation

## 2020-10-18 ENCOUNTER — Other Ambulatory Visit: Payer: Self-pay

## 2020-10-18 ENCOUNTER — Ambulatory Visit: Payer: Medicare Other | Admitting: Dermatology

## 2020-10-18 DIAGNOSIS — Z85828 Personal history of other malignant neoplasm of skin: Secondary | ICD-10-CM

## 2020-10-18 DIAGNOSIS — I8310 Varicose veins of unspecified lower extremity with inflammation: Secondary | ICD-10-CM

## 2020-10-18 DIAGNOSIS — I872 Venous insufficiency (chronic) (peripheral): Secondary | ICD-10-CM

## 2020-10-18 DIAGNOSIS — D492 Neoplasm of unspecified behavior of bone, soft tissue, and skin: Secondary | ICD-10-CM

## 2020-10-18 DIAGNOSIS — D1800 Hemangioma unspecified site: Secondary | ICD-10-CM

## 2020-10-18 DIAGNOSIS — L821 Other seborrheic keratosis: Secondary | ICD-10-CM

## 2020-10-18 DIAGNOSIS — L570 Actinic keratosis: Secondary | ICD-10-CM | POA: Diagnosis not present

## 2020-10-18 DIAGNOSIS — C44219 Basal cell carcinoma of skin of left ear and external auricular canal: Secondary | ICD-10-CM

## 2020-10-18 DIAGNOSIS — Z1283 Encounter for screening for malignant neoplasm of skin: Secondary | ICD-10-CM | POA: Diagnosis not present

## 2020-10-18 DIAGNOSIS — L578 Other skin changes due to chronic exposure to nonionizing radiation: Secondary | ICD-10-CM | POA: Diagnosis not present

## 2020-10-18 DIAGNOSIS — D229 Melanocytic nevi, unspecified: Secondary | ICD-10-CM

## 2020-10-18 DIAGNOSIS — L814 Other melanin hyperpigmentation: Secondary | ICD-10-CM

## 2020-10-18 NOTE — Progress Notes (Signed)
Follow-Up Visit   Subjective  Erika Daniel is a 87 y.o. female who presents for the following: Total body skin exam (Hx of BCCs).  Patient accompanied by niece who contributes to history.  The following portions of the chart were reviewed this encounter and updated as appropriate:       Review of Systems:  No other skin or systemic complaints except as noted in HPI or Assessment and Plan.  Objective  Well appearing patient in no apparent distress; mood and affect are within normal limits.  A full examination was performed including scalp, head, eyes, ears, nose, lips, neck, chest, axillae, abdomen, back, buttocks, bilateral upper extremities, bilateral lower extremities, hands, feet, fingers, toes, fingernails, and toenails. All findings within normal limits unless otherwise noted below.  Objective  L lat forehead above brow: Well healed scar with no evidence of recurrence.   Objective  bil lower legs: Stasis changes lower legs  Objective  Nose x 2, R hand dorsum x 1 (3): Pink scaly macules   Objective  Left upper ear helix: 0.6cm pink pearly pap        Assessment & Plan    Lentigines - Scattered tan macules - Due to sun exposure - Benign-appering, observe - Recommend daily broad spectrum sunscreen SPF 30+ to sun-exposed areas, reapply every 2 hours as needed. - Call for any changes  Seborrheic Keratoses - Stuck-on, waxy, tan-brown papules and/or plaques  - Benign-appearing - Discussed benign etiology and prognosis. - Observe - Call for any changes  Melanocytic Nevi - Tan-brown and/or pink-flesh-colored symmetric macules and papules - Benign appearing on exam today - Observation - Call clinic for new or changing moles - Recommend daily use of broad spectrum spf 30+ sunscreen to sun-exposed areas.   Hemangiomas - Red papules - Discussed benign nature - Observe - Call for any changes  Actinic Damage - Chronic condition, secondary to  cumulative UV/sun exposure - diffuse scaly erythematous macules with underlying dyspigmentation - Recommend daily broad spectrum sunscreen SPF 30+ to sun-exposed areas, reapply every 2 hours as needed.  - Staying in the shade or wearing long sleeves, sun glasses (UVA+UVB protection) and wide brim hats (4-inch brim around the entire circumference of the hat) are also recommended for sun protection.  - Call for new or changing lesions.  Skin cancer screening performed today.  Varicose Veins - Dilated blue, purple or red veins at the lower extremities - Reassured - These can be treated by sclerotherapy (a procedure to inject a medicine into the veins to make them disappear) if desired, but the treatment is not covered by insurance   History of basal cell carcinoma (BCC) L lat forehead above brow  Clear. Observe for recurrence. Call clinic for new or changing lesions.  Recommend regular skin exams, daily broad-spectrum spf 30+ sunscreen use, and photoprotection.     Stasis dermatitis of both legs bil lower legs  Stasis changes due to chronic edema  Stasis in the legs causes chronic leg swelling, which may result in itchy or painful rashes, skin discoloration, skin texture changes, and sometimes ulceration.  Recommend daily compression hose/stockings- easiest to put on first thing in morning, remove at bedtime.  Elevate legs as much as possible. Avoid salt/sodium rich foods.   AK (actinic keratosis) (3) Nose x 2, R hand dorsum x 1  Destruction of lesion - Nose x 2, R hand dorsum x 1  Destruction method: cryotherapy   Informed consent: discussed and consent obtained   Lesion  destroyed using liquid nitrogen: Yes   Region frozen until ice ball extended beyond lesion: Yes   Outcome: patient tolerated procedure well with no complications   Post-procedure details: wound care instructions given    Neoplasm of skin Left upper ear helix  Epidermal / dermal shaving  Lesion diameter (cm):   0.6 Informed consent: discussed and consent obtained   Patient was prepped and draped in usual sterile fashion: area prepped with alcohol. Anesthesia: the lesion was anesthetized in a standard fashion   Anesthetic:  1% lidocaine w/ epinephrine 1-100,000 buffered w/ 8.4% NaHCO3 Instrument used: flexible razor blade   Hemostasis achieved with: pressure, aluminum chloride and electrodesiccation   Outcome: patient tolerated procedure well    Destruction of lesion  Destruction method: electrodesiccation and curettage   Informed consent: discussed and consent obtained   Timeout:  patient name, date of birth, surgical site, and procedure verified Curettage performed in three different directions: Yes   Electrodesiccation performed over the curetted area: Yes   Lesion length (cm):  0.6 Lesion width (cm):  0.6 Margin per side (cm):  0.1 Final wound size (cm):  0.8 Hemostasis achieved with:  pressure, aluminum chloride and electrodesiccation Outcome: patient tolerated procedure well with no complications   Post-procedure details: wound care instructions given   Additional details:  Mupirocin ointment and Bandaid applied    Specimen 1 - Surgical pathology Differential Diagnosis: D48.5 R/O recurrent BCC  Check Margins: No 0.6cm pink pearly pap EDC today  R/o recurrent BCC (L superior ear helix 8/18)  Return in about 6 months (around 04/19/2021) for hx of BCC.   I, Othelia Pulling, RMA, am acting as scribe for Brendolyn Patty, MD .  Documentation: I have reviewed the above documentation for accuracy and completeness, and I agree with the above.  Brendolyn Patty MD

## 2020-10-18 NOTE — Patient Instructions (Addendum)
If you have any questions or concerns for your doctor, please call our main line at [phone] and press option 4 to reach your doctor's medical assistant. If no one answers, please leave a voicemail as directed and we will return your call as soon as possible. Messages left after 4 pm will be answered the following business day.   You may also send us a message via MyChart. We typically respond to MyChart messages within 1-2 business days.  For prescription refills, please ask your pharmacy to contact our office. Our fax number is [phone].  If you have an urgent issue when the clinic is closed that cannot wait until the next business day, you can page your doctor at the number below.    Please note that while we do our best to be available for urgent issues outside of office hours, we are not available 24/7.   If you have an urgent issue and are unable to reach us, you may choose to seek medical care at your doctor's office, retail clinic, urgent care center, or emergency room.  If you have a medical emergency, please immediately call 911 or go to the emergency department.  Pager Numbers  - Dr. Kowalski: [phone]  - Dr. Moye: [phone]  - Dr. Stewart: [phone]  In the event of inclement weather, please call our main line at [phone] for an update on the status of any delays or closures.  Dermatology Medication Tips: Please keep the boxes that topical medications come in in order to help keep track of the instructions about where and how to use these. Pharmacies typically print the medication instructions only on the boxes and not directly on the medication tubes.   If your medication is too expensive, please contact our office at [phone] option 4 or send us a message through MyChart.   We are unable to tell what your co-pay for medications will be in advance as this is different depending on your insurance coverage. However, we may be able to find a  substitute medication at lower cost or fill out paperwork to get insurance to cover a needed medication.   If a prior authorization is required to get your medication covered by your insurance company, please allow us 1-2 business days to complete this process.  Drug prices often vary depending on where the prescription is filled and some pharmacies may offer cheaper prices.  The website www.goodrx.com contains coupons for medications through different pharmacies. The prices here do not account for what the cost may be with help from insurance (it may be cheaper with your insurance), but the website can give you the price if you did not use any insurance.  - You can print the associated coupon and take it with your prescription to the pharmacy.  - You may also stop by our office during regular business hours and pick up a GoodRx coupon card.  - If you need your prescription sent electronically to a different pharmacy, notify our office through [facility] MyChart or by phone at [phone] option 4.     Wound Care Instructions  1. Cleanse wound gently with soap and water once a day then pat dry with clean gauze. Apply a thing coat of Petrolatum (petroleum jelly, "Vaseline") over the wound (unless you have an allergy to this). We recommend that you use a new, sterile tube of Vaseline. Do not pick or remove scabs. Do not remove the yellow or white "healing tissue" from the base of the wound.    2. Cover the wound with fresh, clean, nonstick gauze and secure with paper tape. You may use Band-Aids in place of gauze and tape if the would is small enough, but would recommend trimming much of the tape off as there is often too much. Sometimes Band-Aids can irritate the skin.  3. You should call the office for your biopsy report after 1 week if you have not already been contacted.  4. If you experience any problems, such as abnormal amounts of bleeding, swelling, significant bruising, significant pain,  or evidence of infection, please call the office immediately.  5. FOR ADULT SURGERY PATIENTS: If you need something for pain relief you may take 1 extra strength Tylenol (acetaminophen) AND 2 Ibuprofen (200mg each) together every 4 hours as needed for pain. (do not take these if you are allergic to them or if you have a reason you should not take them.) Typically, you may only need pain medication for 1 to 3 days.     

## 2020-10-25 ENCOUNTER — Telehealth: Payer: Self-pay

## 2020-10-25 NOTE — Telephone Encounter (Signed)
Advised patient's niece biopsy was [facility] and has already been treated with EDC. Patient will keep follow-up as scheduled.

## 2020-10-25 NOTE — Telephone Encounter (Signed)
-----   Message from Brendolyn Patty, MD sent at 10/24/2020  7:43 PM EDT ----- Skin , left upper ear helix BASAL CELL CARCINOMA, NODULAR PATTERN, CLOSE TO MARGIN   BCC skin cancer- already treated with EDC at time of biopsy

## 2020-11-03 ENCOUNTER — Ambulatory Visit: Payer: Medicare Other | Admitting: Podiatry

## 2020-11-03 ENCOUNTER — Encounter: Payer: Self-pay | Admitting: Podiatry

## 2020-11-03 ENCOUNTER — Other Ambulatory Visit: Payer: Self-pay

## 2020-11-03 DIAGNOSIS — N183 Chronic kidney disease, stage 3 unspecified: Secondary | ICD-10-CM

## 2020-11-03 DIAGNOSIS — M79676 Pain in unspecified toe(s): Secondary | ICD-10-CM

## 2020-11-03 DIAGNOSIS — B351 Tinea unguium: Secondary | ICD-10-CM

## 2020-11-03 NOTE — Progress Notes (Signed)
This patient returns to my office for at risk foot care.  This patient requires this care by a professional since this patient will be at risk due to having CKD. This patient is unable to cut nails herself since the patient cannot reach hernails.These nails are painful walking and wearing shoes.  This patient presents for at risk foot care today.  General Appearance  Alert, conversant and in no acute stress.  Vascular  Dorsalis pedis and posterior tibial  pulses are  weakly palpable  bilaterally.  Capillary return is within normal limits  bilaterally. Temperature is within normal limits  bilaterally.  Neurologic  Senn-Weinstein monofilament wire test within normal limits  bilaterally. Muscle power within normal limits bilaterally.  Nails Thick disfigured discolored nails with subungual debris  from hallux to fifth toes bilaterally. No evidence of bacterial infection or drainage bilaterally.  Orthopedic  No limitations of motion  feet .  No crepitus or effusions noted.  No bony pathology or digital deformities noted.  Skin  normotropic skin with no porokeratosis noted bilaterally.  No signs of infections or ulcers noted.     Onychomycosis  Pain in right toes  Pain in left toes  Consent was obtained for treatment procedures.   Mechanical debridement of nails 1-5  bilaterally performed with a nail nipper.  Filed with dremel without incident.    Return office visit     4   months               Told patient to return for periodic foot care and evaluation due to potential at risk complications.   Hulet Ehrmann DPM   

## 2021-03-06 ENCOUNTER — Ambulatory Visit: Payer: Medicare Other | Admitting: Podiatry

## 2021-03-06 ENCOUNTER — Encounter: Payer: Self-pay | Admitting: Podiatry

## 2021-03-06 ENCOUNTER — Other Ambulatory Visit: Payer: Self-pay

## 2021-03-06 DIAGNOSIS — B351 Tinea unguium: Secondary | ICD-10-CM | POA: Diagnosis not present

## 2021-03-06 DIAGNOSIS — M79676 Pain in unspecified toe(s): Secondary | ICD-10-CM

## 2021-03-06 DIAGNOSIS — N183 Chronic kidney disease, stage 3 unspecified: Secondary | ICD-10-CM

## 2021-03-06 NOTE — Progress Notes (Signed)
This patient returns to my office for at risk foot care.  This patient requires this care by a professional since this patient will be at risk due to having  CKD.  This patient is unable to cut nails herself since the patient cannot reach her nails.These nails are painful walking and wearing shoes.  This patient presents for at risk foot care today.  General Appearance  Alert, conversant and in no acute stress.  Vascular  Dorsalis pedis and posterior tibial  pulses are  weakly palpable  bilaterally.  Capillary return is within normal limits  bilaterally. Temperature is within normal limits  bilaterally.  Neurologic  Senn-Weinstein monofilament wire test within normal limits  bilaterally. Muscle power within normal limits bilaterally.  Nails Thick disfigured discolored nails with subungual debris  from hallux to fifth toes bilaterally. No evidence of bacterial infection or drainage bilaterally.  Orthopedic  No limitations of motion  feet .  No crepitus or effusions noted.  No bony pathology or digital deformities noted.  Skin  normotropic skin with no porokeratosis noted bilaterally.  No signs of infections or ulcers noted.     Onychomycosis  Pain in right toes  Pain in left toes  Consent was obtained for treatment procedures.   Mechanical debridement of nails 1-5  bilaterally performed with a nail nipper.  Filed with dremel without incident.    Return office visit    3 months                  Told patient to return for periodic foot care and evaluation due to potential at risk complications.   Addylynn Balin DPM   

## 2021-03-21 ENCOUNTER — Other Ambulatory Visit: Payer: Self-pay

## 2021-03-21 DIAGNOSIS — Z85828 Personal history of other malignant neoplasm of skin: Secondary | ICD-10-CM | POA: Diagnosis not present

## 2021-03-21 DIAGNOSIS — E871 Hypo-osmolality and hyponatremia: Secondary | ICD-10-CM | POA: Diagnosis not present

## 2021-03-21 DIAGNOSIS — I129 Hypertensive chronic kidney disease with stage 1 through stage 4 chronic kidney disease, or unspecified chronic kidney disease: Secondary | ICD-10-CM | POA: Diagnosis not present

## 2021-03-21 DIAGNOSIS — R112 Nausea with vomiting, unspecified: Secondary | ICD-10-CM | POA: Diagnosis present

## 2021-03-21 DIAGNOSIS — Z7982 Long term (current) use of aspirin: Secondary | ICD-10-CM | POA: Diagnosis not present

## 2021-03-21 DIAGNOSIS — Z79899 Other long term (current) drug therapy: Secondary | ICD-10-CM | POA: Insufficient documentation

## 2021-03-21 DIAGNOSIS — N183 Chronic kidney disease, stage 3 unspecified: Secondary | ICD-10-CM | POA: Insufficient documentation

## 2021-03-21 LAB — CBC
HCT: 38.8 % (ref 36.0–46.0)
Hemoglobin: 13.2 g/dL (ref 12.0–15.0)
MCH: 30.5 pg (ref 26.0–34.0)
MCHC: 34 g/dL (ref 30.0–36.0)
MCV: 89.6 fL (ref 80.0–100.0)
Platelets: 499 10*3/uL — ABNORMAL HIGH (ref 150–400)
RBC: 4.33 MIL/uL (ref 3.87–5.11)
RDW: 12.7 % (ref 11.5–15.5)
WBC: 10.7 10*3/uL — ABNORMAL HIGH (ref 4.0–10.5)
nRBC: 0 % (ref 0.0–0.2)

## 2021-03-21 LAB — COMPREHENSIVE METABOLIC PANEL
ALT: 11 U/L (ref 0–44)
AST: 31 U/L (ref 15–41)
Albumin: 3.8 g/dL (ref 3.5–5.0)
Alkaline Phosphatase: 76 U/L (ref 38–126)
Anion gap: 10 (ref 5–15)
BUN: 18 mg/dL (ref 8–23)
CO2: 25 mmol/L (ref 22–32)
Calcium: 8.4 mg/dL — ABNORMAL LOW (ref 8.9–10.3)
Chloride: 95 mmol/L — ABNORMAL LOW (ref 98–111)
Creatinine, Ser: 0.85 mg/dL (ref 0.44–1.00)
GFR, Estimated: >60 mL/min (ref >60)
Glucose, Bld: 153 mg/dL — ABNORMAL HIGH (ref 70–99)
Potassium: 3.3 mmol/L — ABNORMAL LOW (ref 3.5–5.1)
Sodium: 130 mmol/L — ABNORMAL LOW (ref 135–145)
Total Bilirubin: 1.3 mg/dL — ABNORMAL HIGH (ref 0.3–1.2)
Total Protein: 6.7 g/dL (ref 6.5–8.1)

## 2021-03-21 LAB — LIPASE, BLOOD: Lipase: 24 U/L (ref 11–51)

## 2021-03-21 NOTE — ED Triage Notes (Signed)
Pt presents to ER via ems from home with c/o n/v x1 hr.  Pt denies diarrhea, being around other sick people, or any abd pain.  Pt A%O x4 at this time.

## 2021-03-21 NOTE — ED Triage Notes (Signed)
EMS brings pt in from home for c/o N/V

## 2021-03-22 ENCOUNTER — Emergency Department
Admission: EM | Admit: 2021-03-22 | Discharge: 2021-03-22 | Disposition: A | Discharge status: Home / Self Care | Payer: Medicare Other | Attending: Emergency Medicine | Admitting: Emergency Medicine

## 2021-03-22 DIAGNOSIS — E871 Hypo-osmolality and hyponatremia: Secondary | ICD-10-CM

## 2021-03-22 DIAGNOSIS — R112 Nausea with vomiting, unspecified: Secondary | ICD-10-CM

## 2021-03-22 LAB — URINALYSIS, ROUTINE W REFLEX MICROSCOPIC
Bilirubin Urine: NEGATIVE
Glucose, UA: NEGATIVE mg/dL
Hgb urine dipstick: NEGATIVE
Ketones, ur: NEGATIVE mg/dL
Leukocytes,Ua: NEGATIVE
Nitrite: NEGATIVE
Protein, ur: NEGATIVE mg/dL
Specific Gravity, Urine: 1.006 (ref 1.005–1.030)
pH: 8 (ref 5.0–8.0)

## 2021-03-22 LAB — BASIC METABOLIC PANEL
Anion gap: 10 (ref 5–15)
BUN: 16 mg/dL (ref 8–23)
CO2: 26 mmol/L (ref 22–32)
Calcium: 8.7 mg/dL — ABNORMAL LOW (ref 8.9–10.3)
Chloride: 93 mmol/L — ABNORMAL LOW (ref 98–111)
Creatinine, Ser: 0.81 mg/dL (ref 0.44–1.00)
GFR, Estimated: >60 mL/min (ref >60)
Glucose, Bld: 128 mg/dL — ABNORMAL HIGH (ref 70–99)
Potassium: 4 mmol/L (ref 3.5–5.1)
Sodium: 129 mmol/L — ABNORMAL LOW (ref 135–145)

## 2021-03-22 LAB — TROPONIN I (HIGH SENSITIVITY)
Troponin I (High Sensitivity): 3 ng/L (ref <18)
Troponin I (High Sensitivity): 4 ng/L (ref <18)

## 2021-03-22 MED ORDER — LACTATED RINGERS IV BOLUS
1000.0000 mL | Freq: Once | INTRAVENOUS | Status: AC
  Administered 2021-03-22: 1000 mL via INTRAVENOUS

## 2021-03-22 NOTE — ED Notes (Signed)
Pt moved to holding pod to wait for niece to be able to pick up pt for discharge.

## 2021-03-22 NOTE — ED Provider Notes (Signed)
[facility] Emergency Department Provider Note ____________________________________________   Event Date/Time   First MD Initiated Contact with Patient 03/22/21 0112     (approximate)  I have reviewed the triage vital signs and the nursing notes.  HISTORY  Chief Complaint Emesis and Nausea   HPI Erika Daniel is a 87 y.o. femalewho presents to the ED for evaluation of N/V.   Chart review indicates minimal medical hx.  Patient lives at home by herself and ambulates with a walker.  No recent illnesses or falls.  Patient reports a transient episode of nausea and emesis that occurred this afternoon around 3 PM.  She ate around 11:30 AM, as she typically does, she reports she was sitting on the couch when she developed rapid onset nausea and 2 episodes of nonbloody nonbilious emesis.  She reports this sensation passed after a matter of minutes and has not recurred since then.  She reports feeling much better now.  Denies any pain or pressure associated with this event, denies any shortness of breath, syncope, falls or diarrhea.  She reports that she did feel "weak all over."  But this has since resolved.  She reports feeling better now and is asking for something to drink.  Due to prolonged bed holds in the ED and staffing issues, patient waited about 5 hours in the waiting room prior to my evaluation.  In that time, she has had no episodes of nausea or emesis.  Denies any episodes of pain.  Past Medical History:  Diagnosis Date   Basal cell carcinoma 12/31/2016   Left ear sup. helix. Nodular pattern.   Basal cell carcinoma 12/31/2017   Left lateral forehead above brow. Micronodular pattern.    Basal cell carcinoma 04/11/2020   recurrent bcc? Left lateral forehead above brow, excision   Basal cell carcinoma 10/18/2020   L upper ear helix, EDC   Bone loss    Hypertension    Osteoarthrosis     Patient Active Problem List   Diagnosis Date Noted   CKD  (chronic kidney disease) stage 3, GFR 30-59 ml/min ([place]) 06/14/2020   Encounter for general adult medical examination without abnormal findings 04/12/2016   Vitamin D insufficiency 03/24/2014   Benign essential HTN 03/22/2014   GERD without esophagitis 03/22/2014   H/O osteoporosis 03/22/2014    Past Surgical History:  Procedure Laterality Date   EYE SURGERY Right    implant   TONSILLECTOMY      Prior to Admission medications   Medication Sig Start Date End Date Taking? Authorizing Provider  acetaminophen (TYLENOL) 500 MG tablet Take 500 mg by mouth every 6 (six) hours as needed for mild pain or headache.    [provider]  aspirin EC 81 MG tablet Take 81 mg by mouth daily.    [provider]  Biotin 5000 MCG TABS Take 5,000 mcg by mouth daily.    [provider]  hydrochlorothiazide (HYDRODIURIL) 12.5 MG tablet Take 1 tablet by mouth daily. 09/26/20   [provider]  losartan (COZAAR) 50 MG tablet Take 50 mg by mouth daily. 10/25/18   [provider]  losartan (COZAAR) 50 MG tablet Take 1 tablet by mouth daily. 09/23/20   [provider]  Multiple Vitamin (MULTIVITAMIN WITH MINERALS) TABS tablet Take 1 tablet by mouth daily.    [provider]  pantoprazole (PROTONIX) 40 MG tablet Take 40 mg by mouth daily. 09/06/18   [provider]  verapamil (CALAN-SR) 240 MG  CR tablet Take 240 mg by mouth daily. 10/25/18   [provider]    Allergies Bisphosphonates and Codeine  History reviewed. No pertinent family history.  Social History Social History   Tobacco Use   Smoking status: Never   Smokeless tobacco: Never  Substance Use Topics   Alcohol use: No   Drug use: Never    Review of Systems  Constitutional: No fever/chills Eyes: No visual changes. ENT: No sore throat. Cardiovascular: Denies chest pain. Respiratory: Denies shortness of breath. Gastrointestinal: No abdominal pain.    No diarrhea.   No constipation. Positive for nausea and vomiting Genitourinary: Negative for dysuria. Musculoskeletal: Negative for back pain. Skin: Negative for rash. Neurological: Negative for headaches, focal weakness or numbness.  ____________________________________________   PHYSICAL EXAM:  VITAL SIGNS: Vitals:   03/22/21 0230 03/22/21 0330  BP: (!) 184/84 (!) 162/73  Pulse: 86 77  Resp: 20 16  Temp:    SpO2: 95% 96%     Constitutional: Alert and oriented. Well appearing and in no acute distress. Eyes: Conjunctivae are normal. PERRL. EOMI. Head: Atraumatic. Nose: No congestion/rhinnorhea. Mouth/Throat: Mucous membranes are moist.  Oropharynx non-erythematous. Neck: No stridor. No cervical spine tenderness to palpation. Cardiovascular: Normal rate, regular rhythm. Grossly normal heart sounds.  Good peripheral circulation. Respiratory: Normal respiratory effort.  No retractions. Lungs CTAB. Gastrointestinal: Soft , nondistended, nontender to palpation. No CVA tenderness. Musculoskeletal: No lower extremity tenderness nor edema.  No joint effusions. No signs of acute trauma. Neurologic:  Normal speech and language. No gross focal neurologic deficits are appreciated. No gait instability noted. Skin:  Skin is warm, dry and intact. No rash noted. Psychiatric: Mood and affect are normal. Speech and behavior are normal.  ____________________________________________   LABS (all labs ordered are listed, but only abnormal results are displayed)  Labs Reviewed  COMPREHENSIVE METABOLIC PANEL - Abnormal; Notable for the following components:      Result Value   Sodium 130 (*)    Potassium 3.3 (*)    Chloride 95 (*)    Glucose, Bld 153 (*)    Calcium 8.4 (*)    Total Bilirubin 1.3 (*)    All other components within normal limits  CBC - Abnormal; Notable for the following components:   WBC 10.7 (*)    Platelets 499 (*)    All other components within normal limits  URINALYSIS, ROUTINE W  REFLEX MICROSCOPIC - Abnormal; Notable for the following components:   Color, Urine STRAW (*)    APPearance CLEAR (*)    All other components within normal limits  BASIC METABOLIC PANEL - Abnormal; Notable for the following components:   Sodium 129 (*)    Chloride 93 (*)    Glucose, Bld 128 (*)    Calcium 8.7 (*)    All other components within normal limits  LIPASE, BLOOD  TROPONIN I (HIGH SENSITIVITY)  TROPONIN I (HIGH SENSITIVITY)   ____________________________________________  12 Lead EKG  Sinus rhythm, rate of 72 bpm.  Normal axis.  First-degree AV block with PR interval 230 ms.  Questionable intervals.  No clear evidence of acute ischemia. ____________________________________________  RADIOLOGY  ED MD interpretation:    Official radiology report(s): No results found.  ____________________________________________   PROCEDURES and INTERVENTIONS  Procedure(s) performed (including Critical Care):  Procedures  Medications  lactated ringers bolus 1,000 mL (0 mLs Intravenous Stopped 03/22/21 0251)    ____________________________________________   MDM / ED COURSE   Quite healthy and functional 87 year old woman presents to  the ED after a resolved episode of nausea and emesis, without evidence of significant acute pathology, and amenable to outpatient management.  By the time that she is roomed, she is asymptomatic and reports feeling at her baseline.  She had a transient episode of nausea and emesis at home without pain.  She has no evidence of ACS.  Benign abdominal examination and no indications for advanced imaging at this time.  Blood work with mild hyponatremia, flat on recheck.  Provided a liter of lactated Ringer's will be observed her.  No evidence of UTI or pancreatitis.  I see no barriers to outpatient management.  We will discharge with return precautions and recommendation to see her PCP for sodium recheck.  Clinical Course as of 03/22/21 0403  Wed Mar 22, 2021  0401 Reassessed.  Patient ambulates with assistance from nursing back to her stretcher.  She reports feeling great.  She is having difficulty getting a ride, we will hold until morning when she can get a ride home.  No needs at this time. [DS]    Clinical Course User Index [DS] Vladimir Crofts, MD    ____________________________________________   FINAL CLINICAL IMPRESSION(S) / ED DIAGNOSES  Final diagnoses:  Nausea and vomiting, unspecified vomiting type  Hyponatremia     ED Discharge Orders     None        Trixie Maclaren Tamala Julian   Note:  This document was prepared using Dragon voice recognition software and may include unintentional dictation errors.    Vladimir Crofts, MD 03/22/21 [phone]

## 2021-03-22 NOTE — Discharge Instructions (Signed)
Please follow-up with her primary doctor to have her sodium rechecked in the next week or so.  It was a little low at 129 today.

## 2021-05-02 ENCOUNTER — Ambulatory Visit: Payer: Medicare Other | Admitting: Dermatology

## 2021-05-10 ENCOUNTER — Ambulatory Visit
Admission: RE | Admit: 2021-05-10 | Discharge: 2021-05-10 | Disposition: A | Discharge status: Home / Self Care | Payer: Medicare Other | Source: Ambulatory Visit | Attending: Family Medicine | Admitting: Family Medicine

## 2021-05-10 ENCOUNTER — Other Ambulatory Visit: Payer: Self-pay | Admitting: Family Medicine

## 2021-05-10 ENCOUNTER — Other Ambulatory Visit: Payer: Self-pay

## 2021-05-10 DIAGNOSIS — Z87898 Personal history of other specified conditions: Secondary | ICD-10-CM | POA: Diagnosis not present

## 2021-05-12 DIAGNOSIS — N281 Cyst of kidney, acquired: Secondary | ICD-10-CM | POA: Insufficient documentation

## 2021-06-15 ENCOUNTER — Encounter: Payer: Self-pay | Admitting: Podiatry

## 2021-06-15 ENCOUNTER — Ambulatory Visit: Payer: Medicare Other | Admitting: Podiatry

## 2021-06-15 ENCOUNTER — Other Ambulatory Visit: Payer: Self-pay

## 2021-06-15 DIAGNOSIS — N183 Chronic kidney disease, stage 3 unspecified: Secondary | ICD-10-CM

## 2021-06-15 DIAGNOSIS — M79676 Pain in unspecified toe(s): Secondary | ICD-10-CM | POA: Diagnosis not present

## 2021-06-15 DIAGNOSIS — B351 Tinea unguium: Secondary | ICD-10-CM | POA: Diagnosis not present

## 2021-06-15 NOTE — Progress Notes (Signed)
This patient returns to my office for at risk foot care.  This patient requires this care by a professional since this patient will be at risk due to having CKD.  This patient is unable to cut nails herself since the patient cannot reach her nails.These nails are painful walking and wearing shoes.  Patient is brought to the office by her niece.  This patient presents for at risk foot care today.  General Appearance  Alert, conversant and in no acute stress.  Vascular  Dorsalis pedis and posterior tibial  pulses are weakly palpable  bilaterally.  Capillary return is within normal limits  bilaterally. Temperature is within normal limits  bilaterally.  Neurologic  Senn-Weinstein monofilament wire test within normal limits  bilaterally. Muscle power within normal limits bilaterally.  Nails Thick disfigured discolored nails with subungual debris  from hallux to fifth toes bilaterally. No evidence of bacterial infection or drainage bilaterally.  Orthopedic  No limitations of motion  feet .  No crepitus or effusions noted.  No bony pathology or digital deformities noted.  Skin  normotropic skin with no porokeratosis noted bilaterally.  No signs of infections or ulcers noted.     Onychomycosis  Pain in right toes  Pain in left toes  Consent was obtained for treatment procedures.   Mechanical debridement of nails 1-5  bilaterally performed with a nail nipper.  Filed with dremel without incident.    Return office visit    3 months                  Told patient to return for periodic foot care and evaluation due to potential at risk complications.   Ariyonna Twichell DPM   

## 2021-07-16 ENCOUNTER — Other Ambulatory Visit: Payer: Self-pay

## 2021-07-16 ENCOUNTER — Inpatient Hospital Stay
Admission: EM | Admit: 2021-07-16 | Discharge: 2021-07-20 | DRG: 641 | Disposition: A | Discharge status: Home Health | Payer: Medicare Other | Attending: Internal Medicine | Admitting: Internal Medicine

## 2021-07-16 DIAGNOSIS — Z8042 Family history of malignant neoplasm of prostate: Secondary | ICD-10-CM

## 2021-07-16 DIAGNOSIS — Z79899 Other long term (current) drug therapy: Secondary | ICD-10-CM

## 2021-07-16 DIAGNOSIS — R531 Weakness: Secondary | ICD-10-CM

## 2021-07-16 DIAGNOSIS — E876 Hypokalemia: Principal | ICD-10-CM | POA: Diagnosis present

## 2021-07-16 DIAGNOSIS — Z85828 Personal history of other malignant neoplasm of skin: Secondary | ICD-10-CM

## 2021-07-16 DIAGNOSIS — R7989 Other specified abnormal findings of blood chemistry: Secondary | ICD-10-CM | POA: Diagnosis present

## 2021-07-16 DIAGNOSIS — K219 Gastro-esophageal reflux disease without esophagitis: Secondary | ICD-10-CM | POA: Diagnosis present

## 2021-07-16 DIAGNOSIS — N179 Acute kidney failure, unspecified: Secondary | ICD-10-CM | POA: Diagnosis present

## 2021-07-16 DIAGNOSIS — Z8249 Family history of ischemic heart disease and other diseases of the circulatory system: Secondary | ICD-10-CM

## 2021-07-16 DIAGNOSIS — N189 Chronic kidney disease, unspecified: Secondary | ICD-10-CM | POA: Diagnosis present

## 2021-07-16 DIAGNOSIS — R600 Localized edema: Secondary | ICD-10-CM | POA: Diagnosis present

## 2021-07-16 DIAGNOSIS — I10 Essential (primary) hypertension: Secondary | ICD-10-CM | POA: Diagnosis present

## 2021-07-16 DIAGNOSIS — Z885 Allergy status to narcotic agent status: Secondary | ICD-10-CM

## 2021-07-16 DIAGNOSIS — E861 Hypovolemia: Secondary | ICD-10-CM | POA: Diagnosis present

## 2021-07-16 DIAGNOSIS — Z20822 Contact with and (suspected) exposure to covid-19: Secondary | ICD-10-CM | POA: Diagnosis present

## 2021-07-16 DIAGNOSIS — E871 Hypo-osmolality and hyponatremia: Principal | ICD-10-CM

## 2021-07-16 DIAGNOSIS — T502X5A Adverse effect of carbonic-anhydrase inhibitors, benzothiadiazides and other diuretics, initial encounter: Secondary | ICD-10-CM | POA: Diagnosis present

## 2021-07-16 DIAGNOSIS — Z888 Allergy status to other drugs, medicaments and biological substances status: Secondary | ICD-10-CM

## 2021-07-16 DIAGNOSIS — I129 Hypertensive chronic kidney disease with stage 1 through stage 4 chronic kidney disease, or unspecified chronic kidney disease: Secondary | ICD-10-CM | POA: Diagnosis present

## 2021-07-16 DIAGNOSIS — M1990 Unspecified osteoarthritis, unspecified site: Secondary | ICD-10-CM | POA: Diagnosis present

## 2021-07-16 LAB — COMPREHENSIVE METABOLIC PANEL
ALT: 22 U/L (ref 0–44)
AST: 47 U/L — ABNORMAL HIGH (ref 15–41)
Albumin: 4.3 g/dL (ref 3.5–5.0)
Alkaline Phosphatase: 78 U/L (ref 38–126)
Anion gap: 12 (ref 5–15)
BUN: 22 mg/dL (ref 8–23)
CO2: 27 mmol/L (ref 22–32)
Calcium: 9.3 mg/dL (ref 8.9–10.3)
Chloride: 79 mmol/L — ABNORMAL LOW (ref 98–111)
Creatinine, Ser: 1.27 mg/dL — ABNORMAL HIGH (ref 0.44–1.00)
GFR, Estimated: 38 mL/min — ABNORMAL LOW (ref >60)
Glucose, Bld: 117 mg/dL — ABNORMAL HIGH (ref 70–99)
Potassium: 2.5 mmol/L — CL (ref 3.5–5.1)
Sodium: 118 mmol/L — CL (ref 135–145)
Total Bilirubin: 1.5 mg/dL — ABNORMAL HIGH (ref 0.3–1.2)
Total Protein: 7.4 g/dL (ref 6.5–8.1)

## 2021-07-16 LAB — URINALYSIS, ROUTINE W REFLEX MICROSCOPIC
Bacteria, UA: NONE SEEN
Bilirubin Urine: NEGATIVE
Glucose, UA: NEGATIVE mg/dL
Ketones, ur: NEGATIVE mg/dL
Leukocytes,Ua: NEGATIVE
Nitrite: NEGATIVE
Protein, ur: NEGATIVE mg/dL
Specific Gravity, Urine: 1.008 (ref 1.005–1.030)
pH: 6 (ref 5.0–8.0)

## 2021-07-16 LAB — CBC
HCT: 37 % (ref 36.0–46.0)
Hemoglobin: 13.3 g/dL (ref 12.0–15.0)
MCH: 30.4 pg (ref 26.0–34.0)
MCHC: 35.9 g/dL (ref 30.0–36.0)
MCV: 84.5 fL (ref 80.0–100.0)
Platelets: 555 10*3/uL — ABNORMAL HIGH (ref 150–400)
RBC: 4.38 MIL/uL (ref 3.87–5.11)
RDW: 12.4 % (ref 11.5–15.5)
WBC: 10.6 10*3/uL — ABNORMAL HIGH (ref 4.0–10.5)
nRBC: 0 % (ref 0.0–0.2)

## 2021-07-16 LAB — TROPONIN I (HIGH SENSITIVITY): Troponin I (High Sensitivity): 5 ng/L (ref <18)

## 2021-07-16 LAB — BRAIN NATRIURETIC PEPTIDE: B Natriuretic Peptide: 73.9 pg/mL (ref 0.0–100.0)

## 2021-07-16 MED ORDER — SODIUM CHLORIDE 0.9 % IV SOLN: INTRAVENOUS | Status: DC

## 2021-07-16 MED ORDER — POTASSIUM CHLORIDE 10 MEQ/100ML IV SOLN
10.0000 meq | Freq: Once | INTRAVENOUS | Status: AC
  Administered 2021-07-16: 10 meq via INTRAVENOUS
  Filled 2021-07-16: qty 100

## 2021-07-16 MED ORDER — POTASSIUM CHLORIDE CRYS ER 20 MEQ PO TBCR
40.0000 meq | EXTENDED_RELEASE_TABLET | Freq: Once | ORAL | Status: AC
  Administered 2021-07-16: 40 meq via ORAL
  Filled 2021-07-16: qty 2

## 2021-07-16 NOTE — ED Provider Notes (Signed)
? ?[facility] ?Provider Note ? ? ? Event Date/Time  ? First MD Initiated Contact with Patient 07/16/21 2327   ?  (approximate) ? ?History  ? ?Chief Complaint: Weakness ? ?HPI ? ?Erika Daniel is a 88 y.o. female who presents to the emergency department for generalized weakness.  Daughter is here with the patient states the patient is normally very independent still lives alone but today has been feeling very weak and had to slouch down to the floor and sit down due to feeling weak.  Daughter denies that the patient fell, patient denies any head injury or LOC.  Daughter states recently the patient has had lower extremity edema was placed on Lasix approximately 5 days ago last dose was yesterday.  Patient is awake alert she is oriented denies any chest pain shortness of breath but does admit weakness. ? ?Physical Exam  ? ?Triage Vital Signs: ?ED Triage Vitals  ?Enc Vitals Group  ?   BP 07/16/21 2139 (!) 154/58  ?   Pulse Rate 07/16/21 2139 69  ?   Resp 07/16/21 2139 14  ?   Temp 07/16/21 2139 98 ?F (36.7 ?C)  ?   Temp src --   ?   SpO2 07/16/21 2139 93 %  ?   Weight 07/16/21 2140 130 lb (59 kg)  ?   Height 07/16/21 2140 '5\' 3"'$  (1.6 m)  ?   Head Circumference --   ?   Peak Flow --   ?   Pain Score 07/16/21 2140 0  ?   Pain Loc --   ?   Pain Edu? --   ?   Excl. in [place]? --   ? ? ?Most recent vital signs: ?Vitals:  ? 07/16/21 2139  ?BP: (!) 154/58  ?Pulse: 69  ?Resp: 14  ?Temp: 98 ?F (36.7 ?C)  ?SpO2: 93%  ? ? ?General: Awake, no distress.  ?CV:  Good peripheral perfusion.  Regular rate and rhythm  ?Resp:  Normal effort.  Equal breath sounds bilaterally.  ?Abd:  No distention.  Soft, nontender.  No rebound or guarding. ? ? ?ED Results / Procedures / Treatments  ? ?EKG ? ?EKG viewed and interpreted by myself shows a sinus rhythm at 69 bpm with a narrow QRS, normal axis with sinus overall normal intervals besides slight PR prolongation.  No concerning ST changes. ? ?MEDICATIONS ORDERED IN  ED: ?Medications  ?0.9 %  sodium chloride infusion (has no administration in time range)  ?potassium chloride 10 mEq in 100 mL IVPB (has no administration in time range)  ?potassium chloride SA (KLOR-CON M) CR tablet 40 mEq (has no administration in time range)  ? ? ? ?IMPRESSION / MDM / ASSESSMENT AND PLAN / ED COURSE  ?I reviewed the triage vital signs and the nursing notes. ? ?Patient presents to the emergency department for generalized weakness.  Patient has no other complaints today.  No chest pain abdominal pain or dysuria.  Patient has had recent lower extremity edema and has taken Lasix for the past 4 days.  Differential is quite broad but would include metabolic or electrolyte abnormality, dehydration, infectious etiology such as UTI COVID. ? ?Patient's labs have resulted showing a sodium of 118 and a potassium of 2.5.  Possibly related to recent diuresis.  Hyponatremia and hypokalemia could both be the cause of the patient's weakness.  We will begin on normal saline infusion at 100 mL/h, we will replete potassium both IV and orally.  I have  added on a magnesium level.  Reassuringly patient's BNP and troponin are normal.  CBC is overall reassuring.  Urinalysis is nonrevealing.  Patient will need to be admitted to the hospital service for ongoing treatment.  Patient daughter agreeable to plan.  COVID test pending ? ?FINAL CLINICAL IMPRESSION(S) / ED DIAGNOSES  ? ?Hyponatremia ?Hypokalemia ?Weakness ? ? ?Note:  This document was prepared using Dragon voice recognition software and may include unintentional dictation errors. ?  Harvest Dark, MD ?07/16/21 2355 ? ?

## 2021-07-16 NOTE — ED Triage Notes (Signed)
Pt from home with weakness that has progressively getting worse over last 3 weeks. Per ems pt got up to lock her door tonight and was too  continue standing and pt sat on ground. Pt with bilateral lower extremity edema noted.  ?

## 2021-07-16 NOTE — ED Notes (Signed)
LAB CALLED WITH CRITICAL RESULTS ? ?NA+  118 ?K+   2.5 ? ?MD made aware and pt to be roomed ASAP ?

## 2021-07-17 ENCOUNTER — Encounter: Payer: Self-pay | Admitting: Internal Medicine

## 2021-07-17 ENCOUNTER — Other Ambulatory Visit: Payer: Self-pay

## 2021-07-17 DIAGNOSIS — I129 Hypertensive chronic kidney disease with stage 1 through stage 4 chronic kidney disease, or unspecified chronic kidney disease: Secondary | ICD-10-CM | POA: Diagnosis present

## 2021-07-17 DIAGNOSIS — N179 Acute kidney failure, unspecified: Secondary | ICD-10-CM | POA: Diagnosis present

## 2021-07-17 DIAGNOSIS — Z20822 Contact with and (suspected) exposure to covid-19: Secondary | ICD-10-CM | POA: Diagnosis present

## 2021-07-17 DIAGNOSIS — Z8042 Family history of malignant neoplasm of prostate: Secondary | ICD-10-CM | POA: Diagnosis not present

## 2021-07-17 DIAGNOSIS — Z888 Allergy status to other drugs, medicaments and biological substances status: Secondary | ICD-10-CM | POA: Diagnosis not present

## 2021-07-17 DIAGNOSIS — T502X5A Adverse effect of carbonic-anhydrase inhibitors, benzothiadiazides and other diuretics, initial encounter: Secondary | ICD-10-CM | POA: Diagnosis present

## 2021-07-17 DIAGNOSIS — Z885 Allergy status to narcotic agent status: Secondary | ICD-10-CM | POA: Diagnosis not present

## 2021-07-17 DIAGNOSIS — R531 Weakness: Secondary | ICD-10-CM

## 2021-07-17 DIAGNOSIS — Z85828 Personal history of other malignant neoplasm of skin: Secondary | ICD-10-CM | POA: Diagnosis not present

## 2021-07-17 DIAGNOSIS — Z8249 Family history of ischemic heart disease and other diseases of the circulatory system: Secondary | ICD-10-CM | POA: Diagnosis not present

## 2021-07-17 DIAGNOSIS — E861 Hypovolemia: Secondary | ICD-10-CM | POA: Diagnosis present

## 2021-07-17 DIAGNOSIS — K219 Gastro-esophageal reflux disease without esophagitis: Secondary | ICD-10-CM | POA: Diagnosis present

## 2021-07-17 DIAGNOSIS — E871 Hypo-osmolality and hyponatremia: Secondary | ICD-10-CM

## 2021-07-17 DIAGNOSIS — R7989 Other specified abnormal findings of blood chemistry: Secondary | ICD-10-CM | POA: Diagnosis present

## 2021-07-17 DIAGNOSIS — M1990 Unspecified osteoarthritis, unspecified site: Secondary | ICD-10-CM | POA: Diagnosis present

## 2021-07-17 DIAGNOSIS — E876 Hypokalemia: Secondary | ICD-10-CM | POA: Diagnosis present

## 2021-07-17 DIAGNOSIS — Z79899 Other long term (current) drug therapy: Secondary | ICD-10-CM | POA: Diagnosis not present

## 2021-07-17 DIAGNOSIS — R600 Localized edema: Secondary | ICD-10-CM | POA: Diagnosis present

## 2021-07-17 DIAGNOSIS — N189 Chronic kidney disease, unspecified: Secondary | ICD-10-CM | POA: Diagnosis present

## 2021-07-17 HISTORY — DX: Hypokalemia: E87.6

## 2021-07-17 HISTORY — DX: Acute kidney failure, unspecified: N17.9

## 2021-07-17 LAB — RESP PANEL BY RT-PCR (FLU A&B, COVID) ARPGX2
Influenza A by PCR: NEGATIVE
Influenza B by PCR: NEGATIVE
SARS Coronavirus 2 by RT PCR: NEGATIVE

## 2021-07-17 LAB — COMPREHENSIVE METABOLIC PANEL
ALT: 22 U/L (ref 0–44)
AST: 44 U/L — ABNORMAL HIGH (ref 15–41)
Albumin: 3.9 g/dL (ref 3.5–5.0)
Alkaline Phosphatase: 57 U/L (ref 38–126)
Anion gap: 13 (ref 5–15)
BUN: 19 mg/dL (ref 8–23)
CO2: 26 mmol/L (ref 22–32)
Calcium: 9.2 mg/dL (ref 8.9–10.3)
Chloride: 85 mmol/L — ABNORMAL LOW (ref 98–111)
Creatinine, Ser: 0.94 mg/dL (ref 0.44–1.00)
GFR, Estimated: 55 mL/min — ABNORMAL LOW (ref >60)
Glucose, Bld: 113 mg/dL — ABNORMAL HIGH (ref 70–99)
Potassium: 2.8 mmol/L — ABNORMAL LOW (ref 3.5–5.1)
Sodium: 124 mmol/L — ABNORMAL LOW (ref 135–145)
Total Bilirubin: 1.1 mg/dL (ref 0.3–1.2)
Total Protein: 6.8 g/dL (ref 6.5–8.1)

## 2021-07-17 LAB — CBC
HCT: 35.6 % — ABNORMAL LOW (ref 36.0–46.0)
HCT: 36.3 % (ref 36.0–46.0)
Hemoglobin: 12.9 g/dL (ref 12.0–15.0)
Hemoglobin: 13.1 g/dL (ref 12.0–15.0)
MCH: 30.4 pg (ref 26.0–34.0)
MCH: 30.3 pg (ref 26.0–34.0)
MCHC: 36.2 g/dL — ABNORMAL HIGH (ref 30.0–36.0)
MCHC: 36.1 g/dL — ABNORMAL HIGH (ref 30.0–36.0)
MCV: 83.8 fL (ref 80.0–100.0)
MCV: 83.8 fL (ref 80.0–100.0)
Platelets: 520 10*3/uL — ABNORMAL HIGH (ref 150–400)
Platelets: 542 10*3/uL — ABNORMAL HIGH (ref 150–400)
RBC: 4.25 MIL/uL (ref 3.87–5.11)
RBC: 4.33 MIL/uL (ref 3.87–5.11)
RDW: 12.5 % (ref 11.5–15.5)
RDW: 12.4 % (ref 11.5–15.5)
WBC: 10.4 10*3/uL (ref 4.0–10.5)
WBC: 10.1 10*3/uL (ref 4.0–10.5)
nRBC: 0 % (ref 0.0–0.2)
nRBC: 0 % (ref 0.0–0.2)

## 2021-07-17 LAB — MAGNESIUM: Magnesium: 1.9 mg/dL (ref 1.7–2.4)

## 2021-07-17 LAB — SODIUM
Sodium: 122 mmol/L — ABNORMAL LOW (ref 135–145)
Sodium: 126 mmol/L — ABNORMAL LOW (ref 135–145)
Sodium: 126 mmol/L — ABNORMAL LOW (ref 135–145)

## 2021-07-17 LAB — TROPONIN I (HIGH SENSITIVITY): Troponin I (High Sensitivity): 6 ng/L (ref <18)

## 2021-07-17 LAB — CK: Total CK: 411 U/L — ABNORMAL HIGH (ref 38–234)

## 2021-07-17 MED ORDER — VERAPAMIL HCL ER 240 MG PO TBCR
240.0000 mg | EXTENDED_RELEASE_TABLET | Freq: Every day | ORAL | Status: DC
  Administered 2021-07-17 – 2021-07-20 (×4): 240 mg via ORAL
  Filled 2021-07-17 (×4): qty 1

## 2021-07-17 MED ORDER — SODIUM CHLORIDE 0.9 % IV SOLN: INTRAVENOUS | Status: AC

## 2021-07-17 MED ORDER — PANTOPRAZOLE SODIUM 40 MG PO TBEC
40.0000 mg | DELAYED_RELEASE_TABLET | Freq: Every day | ORAL | Status: DC
  Administered 2021-07-17 – 2021-07-20 (×4): 40 mg via ORAL
  Filled 2021-07-17 (×4): qty 1

## 2021-07-17 MED ORDER — HEPARIN SODIUM (PORCINE) 5000 UNIT/ML IJ SOLN
5000.0000 [IU] | Freq: Three times a day (TID) | INTRAMUSCULAR | Status: DC
  Administered 2021-07-17 – 2021-07-20 (×9): 5000 [IU] via SUBCUTANEOUS
  Filled 2021-07-17 (×9): qty 1

## 2021-07-17 MED ORDER — ONDANSETRON 4 MG PO TBDP
4.0000 mg | ORAL_TABLET | Freq: Three times a day (TID) | ORAL | Status: DC | PRN
  Filled 2021-07-17: qty 1

## 2021-07-17 MED ORDER — POTASSIUM CHLORIDE IN NACL 40-0.9 MEQ/L-% IV SOLN
INTRAVENOUS | Status: DC
Start: 2021-07-17 — End: 2021-07-19
  Filled 2021-07-17 (×4): qty 1000

## 2021-07-17 MED ORDER — HYDRALAZINE HCL 20 MG/ML IJ SOLN: 10.0000 mg | Freq: Four times a day (QID) | INTRAMUSCULAR | Status: DC | PRN

## 2021-07-17 MED ORDER — ACETAMINOPHEN 500 MG PO TABS: 500.0000 mg | ORAL_TABLET | Freq: Four times a day (QID) | ORAL | Status: DC | PRN

## 2021-07-17 NOTE — H&P (Signed)
History and Physical    Patient: Erika Daniel [id number]:[id number] DOB: 09/28/1921 DOA: 07/16/2021 DOS: the patient was seen and examined on 07/17/2021 PCP: Dion Body, MD  Patient coming from: Home  Chief Complaint:  Chief Complaint  Patient presents with   Weakness    HPI: Erika Daniel is a 88 y.o. female with medical history significant of CKD, HTN, coming to [place] for generalized weakness. Total about 3 days , after stating her lasix, pt was changed to lasix by pcp. Pt lives by herself. Patient called her children and brought her to ED, pt could not walk and crawled to phone to called her family.  Pt denies any dizziness but has weakness.     Review of Systems: Review of Systems  Constitutional:  Positive for malaise/fatigue.  Neurological:  Positive for weakness.  All other systems reviewed and are negative.  Past Medical History:  Diagnosis Date   Basal cell carcinoma 12/31/2016   Left ear sup. helix. Nodular pattern.   Basal cell carcinoma 12/31/2017   Left lateral forehead above brow. Micronodular pattern.    Basal cell carcinoma 04/11/2020   recurrent bcc? Left lateral forehead above brow, excision   Basal cell carcinoma 10/18/2020   L upper ear helix, EDC   Bone loss    Hypertension    Osteoarthrosis    Past Surgical History:  Procedure Laterality Date   EYE SURGERY Right    implant   TONSILLECTOMY     Social History:  reports that she has never smoked. She has never used smokeless tobacco. She reports that she does not drink alcohol and does not use drugs.  Allergies  Allergen Reactions   Bisphosphonates     Other reaction(s): Other (See Comments), Other (See Comments) Intolerance Intolerance    Codeine Other (See Comments)    Nervous feeling    Family History  Problem Relation Age of Onset   Heart disease Mother    Prostate cancer Father     Prior to Admission medications   Medication Sig Start Date End Date Taking? Authorizing Provider   acetaminophen (TYLENOL) 500 MG tablet Take 500 mg by mouth every 6 (six) hours as needed for mild pain or headache.    [provider]  aspirin EC 81 MG tablet Take 81 mg by mouth daily. Patient not taking: Reported on 06/15/2021    [provider]  Biotin 5000 MCG TABS Take 5,000 mcg by mouth daily.    [provider]  furosemide (LASIX) 20 MG tablet Take 20 mg by mouth daily. Patient not taking: Reported on 06/15/2021 05/10/21   [provider]  hydrochlorothiazide (HYDRODIURIL) 12.5 MG tablet Take 1 tablet by mouth daily. 09/26/20   [provider]  losartan (COZAAR) 50 MG tablet Take 1 tablet by mouth daily. 09/23/20   [provider]  Multiple Vitamin (MULTIVITAMIN WITH MINERALS) TABS tablet Take 1 tablet by mouth daily.    [provider]  ondansetron (ZOFRAN-ODT) 4 MG disintegrating tablet Take 4 mg by mouth every 8 (eight) hours as needed. 05/10/21   [provider]  pantoprazole (PROTONIX) 40 MG tablet Take 40 mg by mouth daily. 09/06/18   [provider]  pantoprazole (PROTONIX) 40 MG tablet Take 1 tablet by mouth daily. 04/24/21   [provider]  verapamil (CALAN-SR) 240 MG CR tablet Take 240 mg by mouth daily. 10/25/18   [provider]   Physical Exam Vitals and nursing note reviewed.  Constitutional:  General: She is not in acute distress.    Appearance: Normal appearance. She is not ill-appearing, toxic-appearing or diaphoretic.  HENT:     Head: Normocephalic and atraumatic.     Right Ear: Hearing and external ear normal.     Left Ear: Hearing and external ear normal.     Nose: Nose normal. No nasal deformity.     Mouth/Throat:     Lips: Pink.     Mouth: Mucous membranes are moist.     Tongue: No lesions.     Pharynx: Oropharynx is clear.  Eyes:     Extraocular Movements: Extraocular movements intact.     Pupils: Pupils are equal, round, and reactive to light.  Neck:      Vascular: No carotid bruit.  Cardiovascular:     Rate and Rhythm: Normal rate and regular rhythm.     Pulses: Normal pulses.     Heart sounds: Normal heart sounds.  Pulmonary:     Effort: Pulmonary effort is normal.     Breath sounds: Normal breath sounds.  Abdominal:     General: Bowel sounds are normal. There is no distension.     Palpations: Abdomen is soft. There is no mass.     Tenderness: There is no abdominal tenderness. There is no guarding.     Hernia: No hernia is present.  Musculoskeletal:     Right lower leg: No edema.     Left lower leg: No edema.  Skin:    General: Skin is warm.  Neurological:     General: No focal deficit present.     Mental Status: She is alert and oriented to person, place, and time.     Cranial Nerves: Cranial nerves 2-12 are intact.     Motor: Motor function is intact.  Psychiatric:        Attention and Perception: Attention normal.        Mood and Affect: Mood normal.        Speech: Speech normal.        Behavior: Behavior normal. Behavior is cooperative.        Cognition and Memory: Cognition normal.   Data Reviewed: Results for orders placed or performed during the hospital encounter of 07/16/21 (from the past 24 hour(s))  CBC     Status: Abnormal   Collection Time: 07/16/21  9:49 PM  Result Value Ref Range   WBC 10.6 (H) 4.0 - 10.5 K/uL   RBC 4.38 3.87 - 5.11 MIL/uL   Hemoglobin 13.3 12.0 - 15.0 g/dL   HCT 37.0 36.0 - 46.0 %   MCV 84.5 80.0 - 100.0 fL   MCH 30.4 26.0 - 34.0 pg   MCHC 35.9 30.0 - 36.0 g/dL   RDW 12.4 11.5 - 15.5 %   Platelets 555 (H) 150 - 400 K/uL   nRBC 0.0 0.0 - 0.2 %  Urinalysis, Routine w reflex microscopic Urine, Clean Catch     Status: Abnormal   Collection Time: 07/16/21  9:49 PM  Result Value Ref Range   Color, Urine STRAW (A) YELLOW   APPearance CLEAR (A) CLEAR   Specific Gravity, Urine 1.008 1.005 - 1.030   pH 6.0 5.0 - 8.0   Glucose, UA NEGATIVE NEGATIVE mg/dL   Hgb urine dipstick SMALL (A)  NEGATIVE   Bilirubin Urine NEGATIVE NEGATIVE   Ketones, ur NEGATIVE NEGATIVE mg/dL   Protein, ur NEGATIVE NEGATIVE mg/dL   Nitrite NEGATIVE NEGATIVE   Leukocytes,Ua NEGATIVE NEGATIVE   RBC /  HPF 0-5 0 - 5 RBC/hpf   WBC, UA 6-10 0 - 5 WBC/hpf   Bacteria, UA NONE SEEN NONE SEEN   Squamous Epithelial / LPF 0-5 0 - 5   Mucus PRESENT    Hyaline Casts, UA PRESENT    Granular Casts, UA PRESENT   Comprehensive metabolic panel     Status: Abnormal   Collection Time: 07/16/21  9:49 PM  Result Value Ref Range   Sodium 118 (LL) 135 - 145 mmol/L   Potassium 2.5 (LL) 3.5 - 5.1 mmol/L   Chloride 79 (L) 98 - 111 mmol/L   CO2 27 22 - 32 mmol/L   Glucose, Bld 117 (H) 70 - 99 mg/dL   BUN 22 8 - 23 mg/dL   Creatinine, Ser 1.27 (H) 0.44 - 1.00 mg/dL   Calcium 9.3 8.9 - 10.3 mg/dL   Total Protein 7.4 6.5 - 8.1 g/dL   Albumin 4.3 3.5 - 5.0 g/dL   AST 47 (H) 15 - 41 U/L   ALT 22 0 - 44 U/L   Alkaline Phosphatase 78 38 - 126 U/L   Total Bilirubin 1.5 (H) 0.3 - 1.2 mg/dL   GFR, Estimated 38 (L) >60 mL/min   Anion gap 12 5 - 15  Brain natriuretic peptide     Status: None   Collection Time: 07/16/21  9:49 PM  Result Value Ref Range   B Natriuretic Peptide 73.9 0.0 - 100.0 pg/mL  Troponin I (High Sensitivity)     Status: None   Collection Time: 07/16/21  9:49 PM  Result Value Ref Range   Troponin I (High Sensitivity) 5 <18 ng/L    Assessment and Plan: * Generalized weakness Attribute to electrolytes abnormalities.  We will also check cpk and follow.  replace electrolytes and thyroid and follow.    Hyponatremia Attribute to acute hypovolemic hyponatremia due to diuretic therapy.  Cont ns @ 100 cc/ hour.  Sodium check q 2 hours.  hold hctz and lasix and torsemide.  Benign essential HTN Blood pressure (!) 158/68, pulse 71, temperature 98 F (36.7 C), resp. rate 18, height '5\' 3"'$  (1.6 m), weight 59 kg, SpO2 97 %. Diuretic held ans we will continue verapamil.  PRN hydralazine.   AKI (acute  kidney injury) [facility]) Lab Results  Component Value Date   CREATININE 1.27 (H) 07/16/2021   CREATININE 0.81 03/22/2021   CREATININE 0.85 03/21/2021  Acute onset of AKI from diuretic therapy.  We will monitor.    GERD without esophagitis Cont iv ppi therapy.   Hypokalemia We will replace and follow levels. Check mag level.     Advance Care Planning:   Code Status: Full Code   Consults:  None  Family Communication:  Gayla Doss (Niece)  [phone] (Home Phone)  Severity of Illness: The appropriate patient status for this patient is OBSERVATION. Observation status is judged to be reasonable and necessary in order to provide the required intensity of service to ensure the patient's safety. The patient's presenting symptoms, physical exam findings, and initial radiographic and laboratory data in the context of their medical condition is felt to place them at decreased risk for further clinical deterioration. Furthermore, it is anticipated that the patient will be medically stable for discharge from the hospital within 2 midnights of admission.   Author: Para Skeans, MD 07/17/2021 1:38 AM  For on call review [email or web address].

## 2021-07-17 NOTE — Assessment & Plan Note (Addendum)
Continue PPI ?

## 2021-07-17 NOTE — ED Notes (Signed)
Pt sitting up in bed in NAD. Pt ate 100% of lunch provided.  ?

## 2021-07-17 NOTE — ED Notes (Signed)
Meal tray given, family at bedside assisting pt. No needs expressed at this time.  ?

## 2021-07-17 NOTE — Evaluation (Signed)
Physical Therapy Evaluation Patient Details Name: Erika Daniel MRN: [id number] DOB: May 09, 1922 Today's Date: 07/17/2021  History of Present Illness  DEIDREA Daniel is a 88 y.o. female with medical history significant of CKD, HTN, coming to [place] for generalized weakness.   Clinical Impression  Pt admitted with above diagnosis. Pt received in bed expressing urgent need for BM. Supervision with HOB elevated to reach EOB. Initially once seated pt scooted too anterior to EoB leading to buttocks near sliding OOB thus requiring PT blocking feet and assist at torso to prevent. Mod VC's for hand placement with ambulation with RW to<> from toilet in room. Upon descent pt attempts to sit on commode but without PT maxA pt would miss toilet completely leading to fall. Pt educated on proper sequencing for safe descent for future transfers. On toilet pt reporting PLOF, DME, home lay out without difficulty. Endorses at baseline she is mod-I in household with RW relying on two nieces for meals, transportation and medicine management but is otherwise indep with remainder of ADL's/IADL's. Pt indep with perihygiene requiring minA to stand from low commode returning to EOB. Throughout gait pt is very rigid, has unsteadiness in frontal plane with tremulous motions of LE's throughout. Pt requiring minA at LE's to return to supine with all needs in reach with HR trending mid 70's to mid 80's BPM throughout session with SPO2 >90% on RA throughout. Pt displaying deficits in poor safety awareness, LE weakness, and poor use of AD leading to pt at increased risk for falls thus pt is unsafe to return home alone. Would benefit from STR prior to transitioning back to home setting. Pt currently with functional limitations due to the deficits listed below (see PT Problem List). Pt will benefit from skilled PT to increase their independence and safety with mobility to allow discharge to the venue listed below.     Recommendations for follow  up therapy are one component of a multi-disciplinary discharge planning process, led by the attending physician.  Recommendations may be updated based on patient status, additional functional criteria and insurance authorization.  Follow Up Recommendations Skilled nursing-short term rehab (<3 hours/day)    Assistance Recommended at Discharge Intermittent Supervision/Assistance  Patient can return home with the following  A little help with walking and/or transfers;Assistance with cooking/housework;Assist for transportation;Direct supervision/assist for medications management;Help with stairs or ramp for entrance    Equipment Recommendations None recommended by PT  Recommendations for Other Services       Functional Status Assessment Patient has had a recent decline in their functional status and demonstrates the ability to make significant improvements in function in a reasonable and predictable amount of time.     Precautions / Restrictions Precautions Precautions: Fall Restrictions Weight Bearing Restrictions: No      Mobility  Bed Mobility Overal bed mobility: Needs Assistance Bed Mobility: Supine to Sit     Supine to sit: Supervision, HOB elevated     General bed mobility comments: Increased time. Pt close to sliding onto floor once seated EOB. Required PT blocking feet and assist at torso to prevent. Patient Response: Cooperative  Transfers Overall transfer level: Needs assistance Equipment used: Rolling walker (2 wheels) Transfers: Sit to/from Stand Sit to Stand: Min guard           General transfer comment: Use of momentum and VC's for hand placement    Ambulation/Gait Ambulation/Gait assistance: Min guard Gait Distance (Feet): 12 Feet Assistive device: Rolling walker (2 wheels) Gait Pattern/deviations: Step-to pattern  General Gait Details: Pt displays very rigid gait, unsteady throughout to<> from toilet in room. Maintains RW anteriorly outside  BOS despite VC's.  Stairs            Wheelchair Mobility    Modified Rankin (Stroke Patients Only)       Balance Overall balance assessment: Needs assistance Sitting-balance support: Bilateral upper extremity supported, Feet supported Sitting balance-Leahy Scale: Fair Sitting balance - Comments: Initially poor as pt close to sliding out of bed requiring PT Assist to prevent. Once feet planted on floor pt maintains static sitting EOB with close supervision.   Standing balance support: Bilateral upper extremity supported, During functional activity Standing balance-Leahy Scale: Poor Standing balance comment: use of RW needed                             Pertinent Vitals/Pain Pain Assessment Pain Assessment: No/denies pain    Home Living Family/patient expects to be discharged to:: Private residence Living Arrangements: Alone Available Help at Discharge: Family;Available PRN/intermittently Type of Home: House Home Access: Stairs to enter Entrance Stairs-Rails: Left Entrance Stairs-Number of Steps: 2   Home Layout: One level Home Equipment: Grab bars - toilet;Shower Land (2 wheels);Grab bars - tub/shower (grabber) Additional Comments: has single step into den    Prior Function Prior Level of Function : Needs assist       Physical Assist : ADLs (physical);Mobility (physical)   ADLs (physical): IADLs Mobility Comments: Relies on RW for support ADLs Comments: Nieces assist in medication management and food. Otherwise pt reports independence     Hand Dominance        Extremity/Trunk Assessment        Lower Extremity Assessment Lower Extremity Assessment: Generalized weakness    Cervical / Trunk Assessment Cervical / Trunk Assessment: Normal  Communication   Communication: No difficulties  Cognition Arousal/Alertness: Awake/alert Behavior During Therapy: WFL for tasks assessed/performed Overall Cognitive Status: Within  Functional Limits for tasks assessed                                          General Comments General comments (skin integrity, edema, etc.): HR trending from upper 70's to mid 80's BPM with mobility.    Exercises Other Exercises Other Exercises: Role of PT in acute setting, D/c recs, safe use of DME.   Assessment/Plan    PT Assessment Patient needs continued PT services  PT Problem List Decreased strength;Decreased mobility;Decreased safety awareness;Decreased activity tolerance;Decreased balance;Decreased knowledge of use of DME       PT Treatment Interventions DME instruction;Therapeutic exercise;Gait training;Balance training;Stair training;Neuromuscular re-education;Functional mobility training;Therapeutic activities;Patient/family education    PT Goals (Current goals can be found in the Care Plan section)  Acute Rehab PT Goals Patient Stated Goal: to go home PT Goal Formulation: With patient Time For Goal Achievement: 07/31/21 Potential to Achieve Goals: Fair    Frequency Min 2X/week     Co-evaluation               AM-PAC PT "6 Clicks" Mobility  Outcome Measure Help needed turning from your back to your side while in a flat bed without using bedrails?: A Little Help needed moving from lying on your back to sitting on the side of a flat bed without using bedrails?: A Little Help needed moving to and from a bed to  a chair (including a wheelchair)?: A Lot Help needed standing up from a chair using your arms (e.g., wheelchair or bedside chair)?: A Little Help needed to walk in hospital room?: A Lot Help needed climbing 3-5 steps with a railing? : A Lot 6 Click Score: 15    End of Session Equipment Utilized During Treatment: Gait belt Activity Tolerance: Patient tolerated treatment well Patient left: in bed;with call bell/phone within reach Nurse Communication: Mobility status PT Visit Diagnosis: Unsteadiness on feet (R26.81);Muscle weakness  (generalized) (M62.81);Other abnormalities of gait and mobility (R26.89);Difficulty in walking, not elsewhere classified (R26.2)    Time: 8478-4128 PT Time Calculation (min) (ACUTE ONLY): 17 min   Charges:     PT Treatments $Gait Training: 8-22 mins       Salem Caster. Fairly IV, PT, DPT Physical Therapist- [facility]  07/17/2021, 3:02 PM

## 2021-07-17 NOTE — Assessment & Plan Note (Addendum)
Suspect hypovolemic in the setting of diuretic therapy.  Sodium level improved with IV fluids ?-- Sodium today 134, stable off IV fluids ?--Continue to hold hctz ?-- Resume Lasix only as needed for leg swelling ?-- Repeat BMP at follow-up in 1 to 2 weeks ?

## 2021-07-17 NOTE — Assessment & Plan Note (Addendum)
Presented with K2.5.   ?Potassium has been replaced.   ?K today stable at 4.0.   ?Monitor BMP at follow-up and replace if needed. ?

## 2021-07-17 NOTE — Assessment & Plan Note (Addendum)
Diuretics were held. ?At discharge, Lasix was made as needed for swelling.  HCTZ and losartan were held at discharge to avoid hypotension as BPs have been controlled with these held. ?Continue verapamil. ? ?

## 2021-07-17 NOTE — Progress Notes (Signed)
Arrived to unit from Emergency Department. Admitted/oriented to unit, call bell and bed mechanics. Call bell within reach and bed in lowest position. Family at bedside.  ?

## 2021-07-17 NOTE — Assessment & Plan Note (Addendum)
Most likely due to significant electrolyte derangements.  PT and OT both recommended home health which was arranged by TOC. ? ?

## 2021-07-17 NOTE — Assessment & Plan Note (Addendum)
Likely prerenal azotemia.   ?Resolved with holding diuretics and providing IV hydration. ?Repeat BMP in 1 to 2 weeks at follow-up. ?

## 2021-07-17 NOTE — ED Notes (Signed)
Secure chat Erika Boroughs, MD regarding expired order for NS infusion to inquire about stopping or continuing. ?

## 2021-07-17 NOTE — ED Notes (Signed)
Pt ate 50% of breakfast tray independently. Pt clean and dry at this time and denies further needs. ?

## 2021-07-17 NOTE — Evaluation (Signed)
Occupational Therapy Evaluation Patient Details Name: MAKYNNA MANOCCHIO MRN: [id number] DOB: 1921/06/25 Today's Date: 07/17/2021   History of Present Illness CHANTELL KUNKLER is a 88 y.o. female with medical history significant of CKD, HTN, coming to [place] for generalized weakness.   Clinical Impression   Pt was seen for OT evaluation this date. Prior to hospital admission, pt was living alone and independent with basic ADL, laundry, and cleaning. Family and Meals on Wheels providing food, and nieces assisting with transportation and medication mgt (setting up weekly pill box). Pt endorses keeping her cell phone with her at all times and used it to call for help when she got so weak she should no longer stand. Currently pt demonstrates impairments as described below (See OT problem list) which functionally limit her ability to perform ADL/self-care tasks at baseline independence. Pt currently requires CGA for bed mobility, CGA to MIN A for ADL transfers 2/2 initial heavy posterior lean, VC for RW mgt, and MIN A for LB ADL tasks. Pt able to perform pericare using lateral lean in sitting with set up and SBA-CGA. Pt denied lightheadedness throughout session. Pt would benefit from skilled OT services to address noted impairments and functional limitations (see below for any additional details) in order to maximize safety and independence while minimizing falls risk and caregiver burden. Upon hospital discharge, recommend STR to maximize pt safety and return to PLOF.     Recommendations for follow up therapy are one component of a multi-disciplinary discharge planning process, led by the attending physician.  Recommendations may be updated based on patient status, additional functional criteria and insurance authorization.   Follow Up Recommendations  Skilled nursing-short term rehab (<3 hours/day)    Assistance Recommended at Discharge Frequent or constant Supervision/Assistance  Patient can return home with  the following A little help with walking and/or transfers;A little help with bathing/dressing/bathroom;Assistance with cooking/housework;Assist for transportation;Help with stairs or ramp for entrance;Direct supervision/assist for medications management;Direct supervision/assist for financial management    Functional Status Assessment  Patient has had a recent decline in their functional status and demonstrates the ability to make significant improvements in function in a reasonable and predictable amount of time.  Equipment Recommendations  BSC/3in1;Other (comment) (2WW)    Recommendations for Other Services       Precautions / Restrictions Precautions Precautions: Fall Restrictions Weight Bearing Restrictions: No      Mobility Bed Mobility Overal bed mobility: Needs Assistance Bed Mobility: Supine to Sit, Sit to Supine     Supine to sit: Min assist, HOB elevated Sit to supine: Min guard        Transfers Overall transfer level: Needs assistance Equipment used: Rolling walker (2 wheels) Transfers: Sit to/from Stand Sit to Stand: Min guard           General transfer comment: VC for RW mgt      Balance Overall balance assessment: Needs assistance Sitting-balance support: Bilateral upper extremity supported, Feet supported, Single extremity supported Sitting balance-Leahy Scale: Fair   Postural control: Posterior lean Standing balance support: Bilateral upper extremity supported, During functional activity, Reliant on assistive device for balance Standing balance-Leahy Scale: Poor Standing balance comment: heavily reliant on RW, posterior Lean, requiring CGA-MIN A to support standing balance + VC to correct                           ADL either performed or assessed with clinical judgement   ADL Overall ADL's :  Needs assistance/impaired                                       General ADL Comments: Pt requires MIN Afor LB ADL, CGA for  ADL trnasfers to [facility] +VC for hand placement/RW mgt, set up and CGA for pericare using lateral lean     Vision         Perception     Praxis      Pertinent Vitals/Pain Pain Assessment Pain Assessment: No/denies pain     Hand Dominance     Extremity/Trunk Assessment Upper Extremity Assessment Upper Extremity Assessment: Generalized weakness   Lower Extremity Assessment Lower Extremity Assessment: Generalized weakness   Cervical / Trunk Assessment Cervical / Trunk Assessment: Normal   Communication Communication Communication: No difficulties   Cognition Arousal/Alertness: Awake/alert Behavior During Therapy: WFL for tasks assessed/performed Overall Cognitive Status: No family/caregiver present to determine baseline cognitive functioning                                 General Comments: grossly WFL,some mild difficulty with slow processing, VC for safety/sequencing with RW; niece in at end of session; autho unclear how much of this is new or baseline     General Comments  HR trending from upper 70's to mid 80's BPM with mobility.    Exercises Other Exercises Other Exercises: ADL transfer training, RW mgt, falls prevention   Shoulder Instructions      Home Living Family/patient expects to be discharged to:: Private residence Living Arrangements: Alone Available Help at Discharge: Family;Available PRN/intermittently Type of Home: House Home Access: Stairs to enter [employer] of Steps: 2 Entrance Stairs-Rails: Left Home Layout: One level     Bathroom Shower/Tub: Teacher, early years/pre: Standard     Home Equipment: Grab bars - toilet;Shower seat;Rolling Environmental consultant (2 wheels);Grab bars - tub/shower (grabber)   Additional Comments: has single step into den      Prior Functioning/Environment Prior Level of Function : Needs assist       Physical Assist : ADLs (physical);Mobility (physical)   ADLs (physical):  IADLs Mobility Comments: Relies on RW for support ADLs Comments: Nieces assist in medication management, food, and transportation. Also receives Meals on Wheels. Otherwise pt reports independence.        OT Problem List: Decreased strength;Decreased activity tolerance;Decreased safety awareness;Decreased knowledge of use of DME or AE;Impaired balance (sitting and/or standing)      OT Treatment/Interventions: Self-care/ADL training;Therapeutic exercise;Therapeutic activities;Cognitive remediation/compensation;DME and/or AE instruction;Energy conservation;Patient/family education;Balance training    OT Goals(Current goals can be found in the care plan section) Acute Rehab OT Goals Patient Stated Goal: go home OT Goal Formulation: With patient Time For Goal Achievement: 07/31/21 Potential to Achieve Goals: Good ADL Goals Pt Will Perform Lower Body Dressing: with supervision;with set-up;sit to/from stand Pt Will Transfer to Toilet: with supervision;ambulating (elevated commode, LRAD PRN) Pt Will Perform Toileting - Clothing Manipulation and hygiene: with modified independence;sitting/lateral leans Additional ADL Goal #1: Pt will complete bed mobility with supervision for safety  OT Frequency: Min 2X/week    Co-evaluation              AM-PAC OT "6 Clicks" Daily Activity     Outcome Measure Help from another person eating meals?: None Help from another person taking care of personal grooming?: A Little  Help from another person toileting, which includes using toliet, bedpan, or urinal?: A Little Help from another person bathing (including washing, rinsing, drying)?: A Little Help from another person to put on and taking off regular upper body clothing?: A Little Help from another person to put on and taking off regular lower body clothing?: A Little 6 Click Score: 19   End of Session Equipment Utilized During Treatment: Gait belt;Rolling walker (2 wheels) Nurse Communication:  Mobility status  Activity Tolerance: Patient tolerated treatment well Patient left: in bed;with call bell/phone within reach;with family/visitor present  OT Visit Diagnosis: Other abnormalities of gait and mobility (R26.89);Muscle weakness (generalized) (M62.81)                Time: 6226-3335 OT Time Calculation (min): 28 min Charges:  OT General Charges $OT Visit: 1 Visit OT Evaluation $OT Eval Moderate Complexity: 1 Mod OT Treatments $Self Care/Home Management : 8-22 mins  Ardeth Perfect., MPH, MS, OTR/L ascom [phone] 07/17/21, 4:55 PM

## 2021-07-17 NOTE — Progress Notes (Signed)
Interval events noted.  She is feeling a lot better.  No vomiting, diarrhea or abdominal pain.  Vital signs are stable.  Sodium and potassium levels are slowly improving.  Creatinine is trending down.  Continue IV fluids and replace potassium intravenously.  Consult PT and OT for generalized weakness. ? ? ? ? ? ? ? ? ?

## 2021-07-18 LAB — MAGNESIUM: Magnesium: 1.8 mg/dL (ref 1.7–2.4)

## 2021-07-18 LAB — BASIC METABOLIC PANEL
Anion gap: 9 (ref 5–15)
BUN: 21 mg/dL (ref 8–23)
CO2: 28 mmol/L (ref 22–32)
Calcium: 8.2 mg/dL — ABNORMAL LOW (ref 8.9–10.3)
Chloride: 92 mmol/L — ABNORMAL LOW (ref 98–111)
Creatinine, Ser: 0.81 mg/dL (ref 0.44–1.00)
GFR, Estimated: >60 mL/min (ref >60)
Glucose, Bld: 103 mg/dL — ABNORMAL HIGH (ref 70–99)
Potassium: 3 mmol/L — ABNORMAL LOW (ref 3.5–5.1)
Sodium: 129 mmol/L — ABNORMAL LOW (ref 135–145)

## 2021-07-18 MED ORDER — POTASSIUM CHLORIDE CRYS ER 20 MEQ PO TBCR
40.0000 meq | EXTENDED_RELEASE_TABLET | Freq: Once | ORAL | Status: AC
  Administered 2021-07-18: 40 meq via ORAL
  Filled 2021-07-18: qty 2

## 2021-07-18 NOTE — Progress Notes (Signed)
Physical Therapy Treatment ?Patient Details ?Name: Erika Daniel ?MRN: [id number] ?DOB: 03/21/1922 ?Today's Date: 07/18/2021 ? ? ?History of Present Illness Erika Daniel is a 88 y.o. female with medical history significant of CKD, HTN, coming to [place] for generalized weakness. ? ?  ?PT Comments  ? ? Pt received upright in recliner agreeable to PT with family present. Pt able to perform all transfers with minguard and safe hand placement to RW today tolerating ambulation of 200' with step through pattern performing dual task of holding conversation and horizontal head turns with no imbalance noted with good safety awareness with min VC's for hand placement with stand to sit transfers. Single step performed as well as pt reports single step to enter/exit sunroom from den. Able to perform x2 after PT demo with min VC's for sequencing and x1 minA from PT due to minor LOB on second attempt. Pt returned to room in recliner with all needs in reach. Pt and family endorse pt does not have to access step to live at home for kitchen, living space, or bathrooms. Pt educated that PT recommends avoiding performing step at home due to LOB performing since pt lives alone, until [facility] PT services can assist and perform with pt. Pt verbalizing understanding. Due to improvements in safety awareness, safe use of RW, and progression of OOB mobility to 200' updating d/c recs to home with [facility] PT.  ?  ?Recommendations for follow up therapy are one component of a multi-disciplinary discharge planning process, led by the attending physician.  Recommendations may be updated based on patient status, additional functional criteria and insurance authorization. ? ?Follow Up Recommendations ? Home health PT ?  ?  ?Assistance Recommended at Discharge Intermittent Supervision/Assistance  ?Patient can return home with the following A little help with walking and/or transfers;Assistance with cooking/housework;Assist for transportation;Direct  supervision/assist for medications management;Help with stairs or ramp for entrance ?  ?Equipment Recommendations ? None recommended by PT  ?  ?Recommendations for Other Services   ? ? ?  ?Precautions / Restrictions Precautions ?Precautions: Fall ?Restrictions ?Weight Bearing Restrictions: No  ?  ? ?Mobility ? Bed Mobility ?  ?  ?  ?  ?  ?  ?  ?General bed mobility comments: NT. In recliner pre and post session. ?Patient Response: Cooperative ? ?Transfers ?Overall transfer level: Needs assistance ?Equipment used: Rolling walker (2 wheels) ?Transfers: Sit to/from Stand ?Sit to Stand: Min guard ?  ?  ?  ?  ?  ?General transfer comment: Safe hand placement with standing. ?  ? ?Ambulation/Gait ?Ambulation/Gait assistance: Min guard, Supervision ?Gait Distance (Feet): 200 Feet ?Assistive device: Rolling walker (2 wheels) ?Gait Pattern/deviations: Step-through pattern ?  ?  ?  ?General Gait Details: No unsteadiness today with gait and safe use of RW throughout with improved safety awareness. ABle to dual task with conversation and head turns along with obstacle navigation in hallways without Pt intervention. First 100' mingaurd, remaining 100' supervision. ? ? ?Stairs ?Stairs: Yes ?Stairs assistance: Min guard ?Stair Management: With walker, Forwards, No rails, Step to pattern ?Number of Stairs: 1 ?General stair comments: To mimic entering/exiting 1 step to enter/exit sunroom and den inside home. Pt required PT demo and minguard to perform x2. Second bout pt required min VC's for sequencing of RW and x1 minor LOB. Per pt and family, pt can avoid step inside home if needed for safety. ? ? ?Wheelchair Mobility ?  ? ?Modified Rankin (Stroke Patients Only) ?  ? ? ?  ?  Balance Overall balance assessment: Needs assistance ?Sitting-balance support: Bilateral upper extremity supported, Feet supported, Single extremity supported ?Sitting balance-Leahy Scale: Fair ?  ?  ?Standing balance support: Bilateral upper extremity supported,  During functional activity, Reliant on assistive device for balance ?Standing balance-Leahy Scale: Fair ?Standing balance comment: Maintains UE's on RW for support ?  ?  ?  ?  ?  ?  ?  ?  ?  ?  ?  ?  ? ?  ?Cognition Arousal/Alertness: Awake/alert ?Behavior During Therapy: [facility] for tasks assessed/performed ?Overall Cognitive Status: Within Functional Limits for tasks assessed ?  ?  ?  ?  ?  ?  ?  ?  ?  ?  ?  ?  ?  ?  ?  ?  ?  ?  ?  ? ?  ?Exercises Other Exercises ?Other Exercises: Education to pt on avoiding single step into den if returning home until [facility] PT services can safely train pt back to independence. ? ?  ?General Comments General comments (skin integrity, edema, etc.): HR WNL during all mobility. ?  ?  ? ?Pertinent Vitals/Pain Pain Assessment ?Pain Assessment: No/denies pain  ? ? ?Home Living   ?  ?  ?  ?  ?  ?  ?  ?  ?  ?   ?  ?Prior Function    ?  ?  ?   ? ?PT Goals (current goals can now be found in the care plan section) Acute Rehab PT Goals ?Patient Stated Goal: to go home ?PT Goal Formulation: With patient ?Time For Goal Achievement: 07/31/21 ?Potential to Achieve Goals: Fair ?Progress towards PT goals: Progressing toward goals ? ?  ?Frequency ? ? ? Min 2X/week ? ? ? ?  ?PT Plan Discharge plan needs to be updated  ? ? ?Co-evaluation   ?  ?  ?  ?  ? ?  ?AM-PAC PT "6 Clicks" Mobility   ?Outcome Measure ? Help needed turning from your back to your side while in a flat bed without using bedrails?: A Little ?Help needed moving from lying on your back to sitting on the side of a flat bed without using bedrails?: A Little ?Help needed moving to and from a bed to a chair (including a wheelchair)?: A Little ?Help needed standing up from a chair using your arms (e.g., wheelchair or bedside chair)?: A Little ?Help needed to walk in hospital room?: A Little ?Help needed climbing 3-5 steps with a railing? : A Little ?6 Click Score: 18 ? ?  ?End of Session Equipment Utilized During Treatment: Gait belt ?Activity  Tolerance: Patient tolerated treatment well ?Patient left: in chair;with call bell/phone within reach;with chair alarm set ?Nurse Communication: Mobility status ?PT Visit Diagnosis: Unsteadiness on feet (R26.81);Muscle weakness (generalized) (M62.81);Other abnormalities of gait and mobility (R26.89);Difficulty in walking, not elsewhere classified (R26.2) ?  ? ? ?Time: 8177-1165 ?PT Time Calculation (min) (ACUTE ONLY): 39 min ? ?Charges:  $Gait Training: 38-52 mins          ?          ? ?Salem Caster. Fairly IV, PT, DPT ?Physical Therapist- [place]  ?[facility]  ?07/18/2021, 3:40 PM ? ?

## 2021-07-18 NOTE — TOC Progression Note (Signed)
Transition of Care (TOC) - Progression Note  ? ? ?Patient Details  ?Name: Erika Daniel ?MRN: [id number] ?Date of Birth: 1921-06-14 ? ?Transition of Care (TOC) CM/SW Contact  ?Conception Oms, RN ?Phone Number: ?07/18/2021, 10:35 AM ? ?Clinical Narrative:   met with the patient to discuss DC plan, She lives alone and would liek to go to [facility] SNF, I did a bedsearch, obtained, PASSR and completed Fl2, Will review the bed offers once obntained ? ? ? ?  ?  ? ?Expected Discharge Plan and Services ?  ?  ?  ?  ?  ?                ?  ?  ?  ?  ?  ?  ?  ?  ?  ?  ? ? ?Social Determinants of Health (SDOH) Interventions ?  ? ?Readmission Risk Interventions ?No flowsheet data found. ? ?

## 2021-07-18 NOTE — NC FL2 (Signed)
?[place] MEDICAID FL2 LEVEL OF CARE SCREENING TOOL  ?  ? ?IDENTIFICATION  ?Patient Name: ?Erika Daniel Birthdate: 1921-09-27 Sex: female Admission Date (Current Location): ?07/16/2021  ?[place] and [place] Number: ? [place] ?  Facility and Address:  ?[facility], [address], [place], [place] [address] ?     Provider Number: ?[id number]  ?Attending Physician Name and Address:  ?Jennye Boroughs, MD ? Relative Name and Phone Number:  ?Diane Niece [phone] ?   ?Current Level of Care: ?Hospital Recommended Level of Care: ?[place] Prior Approval Number: ?  ? ?Date Approved/Denied: ?  PASRR Number: ?[id number] ? ?Discharge Plan: ?SNF ?  ? ?Current Diagnoses: ?Patient Active Problem List  ? Diagnosis Date Noted  ? Generalized weakness 07/17/2021  ? AKI (acute kidney injury) ([place]) 07/17/2021  ? Hyponatremia 07/17/2021  ? Hypokalemia 07/17/2021  ? CKD (chronic kidney disease) stage 3, GFR 30-59 ml/min (HCC) 06/14/2020  ? Encounter for general adult medical examination without abnormal findings 04/12/2016  ? Vitamin D insufficiency 03/24/2014  ? Benign essential HTN 03/22/2014  ? GERD without esophagitis 03/22/2014  ? H/O osteoporosis 03/22/2014  ? ? ?Orientation RESPIRATION BLADDER Height & Weight   ?  ?Self, Time, Situation, Place ? Normal Continent Weight: 59 kg ?Height:  '5\' 3"'$  (160 cm)  ?BEHAVIORAL SYMPTOMS/MOOD NEUROLOGICAL BOWEL NUTRITION STATUS  ?    Continent Diet (see dc summary)  ?AMBULATORY STATUS COMMUNICATION OF NEEDS Skin   ?Extensive Assist Verbally Normal ?  ?  ?  ?    ?     ?     ? ? ?Personal Care Assistance Level of Assistance  ?Bathing, Feeding, Dressing Bathing Assistance: Limited assistance ?Feeding assistance: Independent ?Dressing Assistance: Limited assistance ?   ? ?Functional Limitations Info  ?    ?  ?   ? ? ?SPECIAL CARE FACTORS FREQUENCY  ?PT (By licensed PT), OT (By licensed OT)   ?  ?PT Frequency: 5 times per week ?OT Frequency: 5 times per  week ?  ?  ?  ?   ? ? ?Contractures Contractures Info: Not present  ? ? ?Additional Factors Info  ?Code Status, Allergies Code Status Info: full code ?Allergies Info: Bisphosphonates, Codeine ?  ?  ?  ?   ? ?Current Medications (07/18/2021):  This is the current hospital active medication list ?Current Facility-Administered Medications  ?Medication Dose Route Frequency Provider Last Rate Last Admin  ? 0.9 % NaCl with KCl 40 mEq / L  infusion   Intravenous Continuous Jennye Boroughs, MD 50 mL/hr at 07/17/21 1401 New Bag at 07/17/21 1401  ? acetaminophen (TYLENOL) tablet 500 mg  500 mg Oral Q6H PRN Para Skeans, MD      ? heparin injection 5,000 Units  5,000 Units Subcutaneous Q8H Florina Ou V, MD   5,000 Units at 07/18/21 0535  ? hydrALAZINE (APRESOLINE) injection 10 mg  10 mg Intravenous Q6H PRN Para Skeans, MD      ? ondansetron (ZOFRAN-ODT) disintegrating tablet 4 mg  4 mg Oral Q8H PRN Para Skeans, MD      ? pantoprazole (PROTONIX) EC tablet 40 mg  40 mg Oral Daily Para Skeans, MD   40 mg at 07/18/21 8413  ? verapamil (CALAN-SR) CR tablet 240 mg  240 mg Oral Daily Para Skeans, MD   240 mg at 07/17/21 2440  ? ? ? ?Discharge Medications: ?Please see discharge summary for a list of discharge medications. ? ?Relevant Imaging  Results: ? ?Relevant Lab Results: ? ? ?Additional Information ?SS# [id number] ? ?Conception Oms, RN ? ? ? ? ?

## 2021-07-18 NOTE — Progress Notes (Signed)
Progress Note    Erika Daniel  CHY:[id number] DOB: 1921/08/10  DOA: 07/16/2021 PCP: Dion Body, MD      Brief Narrative:    Medical records reviewed and are as summarized below:  Erika Daniel is a 88 y.o. female with medical history significant for hypertension, osteoarthritis, basal cell carcinoma, who presented to the hospital because of generalized weakness.  She had been taking Lasix for leg swelling.  She was found to have AKI, acute hyponatremia and hypokalemia.  She was treated with IV fluids and potassium was repleted.     Assessment/Plan:   Principal Problem:   Generalized weakness Active Problems:   Benign essential HTN   Hyponatremia   AKI (acute kidney injury) ([place])   GERD without esophagitis   Hypokalemia   Body mass index is 23.03 kg/m.  Acute hyponatremia: Sodium level is improving.  Continue IV fluids.  Monitor BMP.  Hypokalemia: Continue potassium repletion, monitor levels.  Generalized weakness: PT and OT recommend discharge to SNF.  Hypertension: Continue verapamil.  HCTZ, Lasix and losartan have been held.  AKI: Resolved   Diet Order             Diet Heart Room service appropriate? Yes; Fluid consistency: Thin  Diet effective now                     Consultants: None  Procedures: None    Medications:    heparin  5,000 Units Subcutaneous Q8H   pantoprazole  40 mg Oral Daily   verapamil  240 mg Oral Daily   Continuous Infusions:  0.9 % NaCl with KCl 40 mEq / L 50 mL/hr at 07/17/21 1401     Anti-infectives (From admission, onward)    None              Family Communication/Anticipated D/C date and plan/Code Status   DVT prophylaxis: heparin injection 5,000 Units Start: 07/17/21 0600     Code Status: Full Code  Family Communication: None Disposition Plan: Plan to discharge to SNF in 2 days   Status is: Inpatient Remains inpatient appropriate because: On IV fluids for hyponatremia,  awaiting placement to SNF       Subjective:   Interval events noted.  She complains of generalized weakness.  No shortness of breath or chest pain.  No nausea, vomiting or diarrhea.  Objective:    Vitals:   07/18/21 0041 07/18/21 0522 07/18/21 0735 07/18/21 1129  BP: (!) 117/47 (!) 116/46 (!) 127/51 (!) 114/48  Pulse: 64 65 68 65  Resp: '16 16 15 17  '$ Temp: 98.4 F (36.9 C) 98.2 F (36.8 C) 97.8 F (36.6 C) 98 F (36.7 C)  TempSrc:      SpO2: 95% 93% 93% 95%  Weight:      Height:       No data found.   Intake/Output Summary (Last 24 hours) at 07/18/2021 1253 Last data filed at 07/18/2021 1045 Gross per 24 hour  Intake 788.84 ml  Output 600 ml  Net 188.84 ml   Filed Weights   07/16/21 2140  Weight: 59 kg    Exam:  GEN: NAD SKIN: Warm and dry.  Small wound on the distal right leg EYES: EOMI ENT: MMM CV: RRR PULM: CTA B ABD: soft, ND, NT, +BS CNS: AAO x 3, non focal EXT: Mild right leg edema tenderness        Data Reviewed:   I have personally reviewed following labs  and imaging studies:  Labs: Labs show the following:   Basic Metabolic Panel: Recent Labs  Lab 07/16/21 2149 07/17/21 0241 07/17/21 0427 07/17/21 0646 07/17/21 1226 07/18/21 0319  NA 118* 122* 124* 126* 126* 129*  K 2.5*  --  2.8*  --   --  3.0*  CL 79*  --  85*  --   --  92*  CO2 27  --  26  --   --  28  GLUCOSE 117*  --  113*  --   --  103*  BUN 22  --  19  --   --  21  CREATININE 1.27*  --  0.94  --   --  0.81  CALCIUM 9.3  --  9.2  --   --  8.2*  MG 1.9  --   --   --   --  1.8   GFR Estimated Creatinine Clearance: 32.1 mL/min (by C-G formula based on SCr of 0.81 mg/dL). Liver Function Tests: Recent Labs  Lab 07/16/21 2149 07/17/21 0427  AST 47* 44*  ALT 22 22  ALKPHOS 78 57  BILITOT 1.5* 1.1  PROT 7.4 6.8  ALBUMIN 4.3 3.9   No results for input(s): LIPASE, AMYLASE in the last 168 hours. No results for input(s): AMMONIA in the last 168 hours. Coagulation  profile No results for input(s): INR, PROTIME in the last 168 hours.  CBC: Recent Labs  Lab 07/16/21 2149 07/17/21 0241 07/17/21 0427  WBC 10.6* 10.4 10.1  HGB 13.3 12.9 13.1  HCT 37.0 35.6* 36.3  MCV 84.5 83.8 83.8  PLT 555* 520* 542*   Cardiac Enzymes: Recent Labs  Lab 07/17/21 0427  CKTOTAL 411*   BNP (last 3 results) No results for input(s): PROBNP in the last 8760 hours. CBG: No results for input(s): GLUCAP in the last 168 hours. D-Dimer: No results for input(s): DDIMER in the last 72 hours. Hgb A1c: No results for input(s): HGBA1C in the last 72 hours. Lipid Profile: No results for input(s): CHOL, HDL, LDLCALC, TRIG, CHOLHDL, LDLDIRECT in the last 72 hours. Thyroid function studies: No results for input(s): TSH, T4TOTAL, T3FREE, THYROIDAB in the last 72 hours.  Invalid input(s): FREET3 Anemia work up: No results for input(s): VITAMINB12, FOLATE, FERRITIN, TIBC, IRON, RETICCTPCT in the last 72 hours. Sepsis Labs: Recent Labs  Lab 07/16/21 2149 07/17/21 0241 07/17/21 0427  WBC 10.6* 10.4 10.1    Microbiology Recent Results (from the past 240 hour(s))  Resp Panel by RT-PCR (Flu A&B, Covid) Nasopharyngeal Swab     Status: None   Collection Time: 07/17/21 12:58 AM   Specimen: Nasopharyngeal Swab; Nasopharyngeal(NP) swabs in vial transport medium  Result Value Ref Range Status   SARS Coronavirus 2 by RT PCR NEGATIVE NEGATIVE Final    Comment: (NOTE) SARS-CoV-2 target nucleic acids are NOT DETECTED.  The SARS-CoV-2 RNA is generally detectable in upper respiratory specimens during the acute phase of infection. The lowest concentration of SARS-CoV-2 viral copies this assay can detect is 138 copies/mL. A negative result does not preclude SARS-Cov-2 infection and should not be used as the sole basis for treatment or other patient management decisions. A negative result may occur with  improper specimen collection/handling, submission of specimen other than  nasopharyngeal swab, presence of viral mutation(s) within the areas targeted by this assay, and inadequate number of viral copies(<138 copies/mL). A negative result must be combined with clinical observations, patient history, and epidemiological information. The expected result is Negative.  Fact Sheet  for Patients:  EntrepreneurPulse.com.au  Fact Sheet for Healthcare Providers:  IncredibleEmployment.be  This test is no t yet approved or cleared by the [place] FDA and  has been authorized for detection and/or diagnosis of SARS-CoV-2 by FDA under an Emergency Use Authorization (EUA). This EUA will remain  in effect (meaning this test can be used) for the duration of the COVID-19 declaration under Section 564(b)(1) of the Act, 21 U.S.C.section 360bbb-3(b)(1), unless the authorization is terminated  or revoked sooner.       Influenza A by PCR NEGATIVE NEGATIVE Final   Influenza B by PCR NEGATIVE NEGATIVE Final    Comment: (NOTE) The Xpert Xpress SARS-CoV-2/FLU/RSV plus assay is intended as an aid in the diagnosis of influenza from Nasopharyngeal swab specimens and should not be used as a sole basis for treatment. Nasal washings and aspirates are unacceptable for Xpert Xpress SARS-CoV-2/FLU/RSV testing.  Fact Sheet for Patients: EntrepreneurPulse.com.au  Fact Sheet for Healthcare Providers: IncredibleEmployment.be  This test is not yet approved or cleared by the [place] FDA and has been authorized for detection and/or diagnosis of SARS-CoV-2 by FDA under an Emergency Use Authorization (EUA). This EUA will remain in effect (meaning this test can be used) for the duration of the COVID-19 declaration under Section 564(b)(1) of the Act, 21 U.S.C. section 360bbb-3(b)(1), unless the authorization is terminated or revoked.  Performed at [facility], [address] [place], [place]  [address]     Procedures and diagnostic studies:  No results found.             LOS: 1 day   Maudell Stanbrough  [facility]   Pager on [email or web address]. If 7PM-7AM, please contact night-coverage at [email or web address]     07/18/2021, 12:53 PM

## 2021-07-18 NOTE — Plan of Care (Signed)

## 2021-07-19 LAB — BASIC METABOLIC PANEL
Anion gap: 5 (ref 5–15)
BUN: 18 mg/dL (ref 8–23)
CO2: 27 mmol/L (ref 22–32)
Calcium: 8.7 mg/dL — ABNORMAL LOW (ref 8.9–10.3)
Chloride: 101 mmol/L (ref 98–111)
Creatinine, Ser: 0.82 mg/dL (ref 0.44–1.00)
GFR, Estimated: >60 mL/min (ref >60)
Glucose, Bld: 100 mg/dL — ABNORMAL HIGH (ref 70–99)
Potassium: 4.4 mmol/L (ref 3.5–5.1)
Sodium: 133 mmol/L — ABNORMAL LOW (ref 135–145)

## 2021-07-19 LAB — MAGNESIUM: Magnesium: 1.9 mg/dL (ref 1.7–2.4)

## 2021-07-19 LAB — CK: Total CK: 138 U/L (ref 38–234)

## 2021-07-19 NOTE — TOC Progression Note (Addendum)
Transition of Care (TOC) - Progression Note  ? ? ?Patient Details  ?Name: Erika Daniel ?MRN: [id number] ?Date of Birth: 1921/06/10 ? ?Transition of Care (TOC) CM/SW Contact  ?Conception Oms, RN ?Phone Number: ?07/19/2021, 11:57 AM ? ?Clinical Narrative:   Patient walked over 200 ft and will go home, Ins will likely not approve SNF, the patient [facility] the patient for [facility] and OT She has a toilet riser, grab bars, grabber, rolling walker at home and does not need additional DME ?I notified her niece Diane that the plan is no longer to go to SNF ? ? ? ?Expected Discharge Plan: [place] ?Barriers to Discharge: Continued Medical Work up ? ?Expected Discharge Plan and Services ?Expected Discharge Plan: [place] ?  ?  ?  ?  ?                ?  ?  ?  ?  ?  ?  ?  ?  ?  ?  ? ? ?Social Determinants of Health (SDOH) Interventions ?  ? ?Readmission Risk Interventions ?No flowsheet data found. ? ?

## 2021-07-19 NOTE — Progress Notes (Signed)
?  Progress Note ? ? ?Patient: Erika Daniel WOE:[id number] DOB: Nov 02, 1921 DOA: 07/16/2021     2 ?DOS: the patient was seen and examined on 07/19/2021 ?  ?Brief hospital course: ?Erika Daniel is a 88 y.o. female with medical history significant for hypertension, osteoarthritis, basal cell carcinoma, who presented to the hospital because of generalized weakness.  She had been taking Lasix for leg swelling. ?  ?She was found to have AKI, acute hyponatremia and hypokalemia.  She was treated with IV fluids and potassium was repleted.   ? ?Assessment and Plan: ?* Generalized weakness ?Most likely due to significant electrolyte derangements.  PT recommending home health, OT recommending SNF.  Fall precautions. ? ? ?Hyponatremia ?Suspect hypovolemic in the setting of diuretic therapy.  Sodium level improving with IV fluids ?--Stop fluids and monitor BMP ?--Continue to hold hctz and lasix and torsemide. ? ?Benign essential HTN ?Diuretics on hold. ?Continue verapamil. ?As needed hydralazine. ? ?AKI (acute kidney injury) ([place]) ?Likely prerenal azotemia.  Resolved with holding diuretics and IV hydration. ? ? ?GERD without esophagitis ?On IV PPI for now. ? ?Hypokalemia ?Presented with K2.5.   ?Potassium has been replaced.   ?K today 4.4.   ?Monitor BMP and replace as needed. ? ? ? ? ?  ? ?Subjective: Patient up in recliner chair when seen today.  She reports overall feeling well.  Denies any chest pain or shortness of breath, fevers or chills, nausea vomiting or diarrhea.  She says she would be agreeable to rehab if that is still the recommendation.  No other acute complaints or acute events reported. ? ?Physical Exam: ?Vitals:  ? 07/19/21 0419 07/19/21 0726 07/19/21 1137 07/19/21 1534  ?BP: (!) 147/57 (!) 154/66 (!) 179/66 (!) 150/56  ?Pulse: 66 68 77 68  ?Resp: '17 17 20 16  '$ ?Temp: (!) 97.5 ?F (36.4 ?C) 97.7 ?F (36.5 ?C) 97.6 ?F (36.4 ?C) 97.8 ?F (36.6 ?C)  ?TempSrc:      ?SpO2: 97% 96% 100% 98%  ?Weight:      ?Height:       ? ?General exam: awake, alert, no acute distress ?HEENT: atraumatic, clear conjunctiva, anicteric sclera, moist mucus membranes, hearing grossly normal  ?Respiratory system: CTAB, no wheezes, rales or rhonchi, normal respiratory effort. ?Cardiovascular system: normal S1/S2, RRR,  no pedal edema.   ?Gastrointestinal system: soft, NT, ND, no HSM felt, +bowel sounds. ?Central nervous system: no gross focal neurologic deficits, normal speech ?Extremities: moves all , no edema, normal tone ?Skin: dry, intact, normal temperature ?Psychiatry: normal mood, congruent affect, judgement and insight appear normal ? ? ?Data Reviewed: ? ?Labs reviewed and notable for sodium 133, normal potassium 4.4, glucose 100, calcium 8.7 ? ?Family Communication: None at bedside, will attempt to call ? ?Disposition: ?Status is: Inpatient ?Remains inpatient appropriate because: Severity of illness, stopping IV fluids and monitor electrolytes for stability.  Anticipate medically stable for discharge tomorrow. ? ? ? Planned Discharge Destination: Home with Home Health versus SNF ? ? ? ?Time spent: 35 minutes ? ?Author: ?Ezekiel Slocumb, DO ?07/19/2021 4:13 PM ? ?For on call review [email or web address].  ?

## 2021-07-19 NOTE — TOC Progression Note (Addendum)
Transition of Care (TOC) - Progression Note  ? ? ?Patient Details  ?Name: CHEYNA RETANA ?MRN: [id number] ?Date of Birth: 1922/03/28 ? ?Transition of Care (TOC) CM/SW Contact  ?Conception Oms, RN ?Phone Number: ?07/19/2021, 11:02 AM ? ?Clinical Narrative:    ?Met with the patient and reviewed the bed offers, He niece Shauna Hugh  is her POA, The patient chose the bed offer from [facility], She will transport via EMS, I notified Hilda Blades at [employer], Started Ins approval with [employer] ?Ref number [id number] ? ?  ?  ? ?Expected Discharge Plan and Services ?  ?  ?  ?  ?  ?                ?  ?  ?  ?  ?  ?  ?  ?  ?  ?  ? ? ?Social Determinants of Health (SDOH) Interventions ?  ? ?Readmission Risk Interventions ?No flowsheet data found. ? ?

## 2021-07-19 NOTE — Progress Notes (Signed)
Occupational Therapy Treatment ?Patient Details ?Name: Erika Daniel ?MRN: [id number] ?DOB: 1921-06-29 ?Today's Date: 07/19/2021 ? ? ?History of present illness Erika Daniel is a 88 y.o. female with medical history significant of CKD, HTN, coming to [place] for generalized weakness. ?  ?OT comments ? Pt seen for OT Tx this date to f/u re: safety with ADLs/ADL mobility. She demos ability to perform ADL transfers and fxl mobility in room with 2WW with SUPV and minimal safety cueing. She completes bathing and dressing tasks sink-side with SETUP/SUPV. Demos good tolerance for tasks performed. Completes fxl mobility to recliner end of session with SUPV with 2WW. Left with all needs met and in reach. D/c rec updated to [facility] as pt demos significant improvement with ADLs/ADL mobility this date. Will continue to follow acutely.   ? ?Recommendations for follow up therapy are one component of a multi-disciplinary discharge planning process, led by the attending physician.  Recommendations may be updated based on patient status, additional functional criteria and insurance authorization. ?   ?Follow Up Recommendations ? Home health OT  ?  ?Assistance Recommended at Discharge Frequent or constant Supervision/Assistance  ?Patient can return home with the following ? A little help with walking and/or transfers;A little help with bathing/dressing/bathroom;Assistance with cooking/housework;Assist for transportation;Help with stairs or ramp for entrance;Direct supervision/assist for medications management;Direct supervision/assist for financial management ?  ?Equipment Recommendations ? BSC/3in1;Other (comment) (2ww)  ?  ?Recommendations for Other Services   ? ?  ?Precautions / Restrictions Precautions ?Precautions: Fall ?Restrictions ?Weight Bearing Restrictions: No  ? ? ?  ? ?Mobility Bed Mobility ?Overal bed mobility: Needs Assistance ?Bed Mobility: Supine to Sit ?  ?  ?Supine to sit: Supervision, HOB elevated ?  ?  ?General bed  mobility comments: increased time ?  ? ?Transfers ?Overall transfer level: Needs assistance ?Equipment used: Rolling walker (2 wheels) ?Transfers: Sit to/from Stand ?Sit to Stand: Supervision ?  ?  ?  ?  ?  ?General transfer comment: requires only mimimal cues for hand placement with commode transfer, but demos good carryover for transfer to recliner. ?  ?  ?Balance Overall balance assessment: Needs assistance ?Sitting-balance support: Bilateral upper extremity supported, Feet supported, Single extremity supported ?Sitting balance-Leahy Scale: Fair ?  ?  ?Standing balance support: Bilateral upper extremity supported, During functional activity, Reliant on assistive device for balance ?Standing balance-Leahy Scale: Fair ?  ?  ?  ?  ?  ?  ?  ?  ?  ?  ?  ?  ?   ? ?ADL either performed or assessed with clinical judgement  ? ?ADL Overall ADL's : Needs assistance/impaired ?  ?  ?Grooming: Oral care;Set up;Supervision/safety;Standing ?Grooming Details (indicate cue type and reason): sink-side ?Upper Body Bathing: Set up;Supervision/ safety;Standing ?Upper Body Bathing Details (indicate cue type and reason): sinkside ?Lower Body Bathing: Supervison/ safety;Sit to/from stand ?Lower Body Bathing Details (indicate cue type and reason): close SBA at sink-side d/t slightly more dynamic nature of this task, but still does not require phyiscal assistance ?Upper Body Dressing : Set up;Sitting ?  ?Lower Body Dressing: Set up;Sitting/lateral leans ?Lower Body Dressing Details (indicate cue type and reason): socks ?Toilet Transfer: Supervision/safety;Rolling walker (2 wheels);Ambulation ?Toilet Transfer Details (indicate cue type and reason): amb ~10' in the room with CGA fading to SUPV to get to [facility] ?Toileting- Clothing Manipulation and Hygiene: Set up;Sit to/from stand ?  ?  ?  ?Functional mobility during ADLs: Supervision/safety;Rolling walker (2 wheels) (to [facility], then to recliner on opposite  side of room) ?  ?  ? ?Extremity/Trunk  Assessment   ?  ?  ?  ?  ?  ? ?Vision   ?  ?  ?Perception   ?  ?Praxis   ?  ? ?Cognition Arousal/Alertness: Awake/alert ?Behavior During Therapy: [facility] for tasks assessed/performed ?Overall Cognitive Status: Within Functional Limits for tasks assessed ?  ?  ?  ?  ?  ?  ?  ?  ?  ?  ?  ?  ?  ?  ?  ?  ?General Comments: some mild higher level cognitive processing issues, but overall able to follow all one to two step commands and basically oriented (to month, year, within 2 days of today's date, place, and most aspects of situation). ?  ?  ?   ?Exercises Other Exercises ?Other Exercises: OT engages pt in bathing/dressing tasks sinkside ? ?  ?Shoulder Instructions   ? ? ?  ?General Comments    ? ? ?Pertinent Vitals/ Pain       Pain Assessment ?Pain Assessment: No/denies pain ? ?Home Living   ?  ?  ?  ?  ?  ?  ?  ?  ?  ?  ?  ?  ?  ?  ?  ?  ?  ?  ? ?  ?Prior Functioning/Environment    ?  ?  ?  ?   ? ?Frequency ? Min 2X/week  ? ? ? ? ?  ?Progress Toward Goals ? ?OT Goals(current goals can now be found in the care plan section) ? Progress towards OT goals: Progressing toward goals ? ?Acute Rehab OT Goals ?Patient Stated Goal: go home ?OT Goal Formulation: With patient ?Time For Goal Achievement: 07/31/21 ?Potential to Achieve Goals: Good  ?Plan Discharge plan needs to be updated;Frequency remains appropriate   ? ?Co-evaluation ? ? ?   ?  ?  ?  ?  ? ?  ?AM-PAC OT "6 Clicks" Daily Activity     ?Outcome Measure ? ? Help from another person eating meals?: None ?Help from another person taking care of personal grooming?: A Little ?Help from another person toileting, which includes using toliet, bedpan, or urinal?: A Little ?Help from another person bathing (including washing, rinsing, drying)?: A Little ?Help from another person to put on and taking off regular upper body clothing?: A Little ?Help from another person to put on and taking off regular lower body clothing?: A Little ?6 Click Score: 19 ? ?  ?End of Session Equipment  Utilized During Treatment: Gait belt;Rolling walker (2 wheels) ? ?OT Visit Diagnosis: Other abnormalities of gait and mobility (R26.89);Muscle weakness (generalized) (M62.81) ?  ?Activity Tolerance Patient tolerated treatment well ?  ?Patient Left in bed;with call bell/phone within reach;with family/visitor present ?  ?Nurse Communication Mobility status ?  ? ?   ? ?Time: 9794-8016 ?OT Time Calculation (min): 26 min ? ?Charges: OT General Charges ?$OT Visit: 1 Visit ?OT Treatments ?$Self Care/Home Management : 8-22 mins ?$Therapeutic Activity: 8-22 mins ? ?Gerrianne Scale, [place], OTR/L ?ascom [phone] ?07/19/21, 6:02 PM  ?

## 2021-07-19 NOTE — Progress Notes (Signed)
Physical Therapy Treatment ?Patient Details ?Name: Erika Daniel ?MRN: [id number] ?DOB: [birth year] ?Today's Date: 07/19/2021 ? ? ?History of Present Illness Erika Daniel is a 88 y.o. female with medical history significant of CKD, HTN, coming to [place] for generalized weakness. ? ?  ?PT Comments  ? ? Pt received upright in recliner agreeable to PT services. Focus on session is with improving safety awareness, safe use of RW with gait/transfers, and asc/desc single step. Pt improving with safety awareness and use of RW today displaying multiple repetitions with STS with safe hand placement with supervision. Pt demonstrating great understanding of RW sequencing from standing to sitting with turning RW, backing up to chair but does require min, VC's for reaching back with UE and decreased eccentric control but is able to sit down multiple times safely.  Pt also progressed to safe management and sequencing of RW asc/desc step without LOB or need for VC's only using minguard from PT for safety. Pt still educated to only perform step with family supervision and/orf with [facility] PT due to pt living alone. Pt verbalizing understanding. Pt returning to room with all needs in place. D/c recs remain appropriate for home with [facility] PT as pt has greatly improved her safety, ambulation distances, and safety awareness.  ?  ?Recommendations for follow up therapy are one component of a multi-disciplinary discharge planning process, led by the attending physician.  Recommendations may be updated based on patient status, additional functional criteria and insurance authorization. ? ?Follow Up Recommendations ? Home health PT ?  ?  ?Assistance Recommended at Discharge Intermittent Supervision/Assistance  ?Patient can return home with the following A little help with walking and/or transfers;Assistance with cooking/housework;Assist for transportation;Direct supervision/assist for medications management;Help with stairs or ramp for entrance ?   ?Equipment Recommendations ? None recommended by PT  ?  ?Recommendations for Other Services   ? ? ?  ?Precautions / Restrictions Precautions ?Precautions: Fall ?Restrictions ?Weight Bearing Restrictions: No  ?  ? ?Mobility ? Bed Mobility ?  ?  ?  ?  ?  ?  ?  ?General bed mobility comments: NT. In recliner pre and post session. ?Patient Response: Cooperative ? ?Transfers ?Overall transfer level: Needs assistance ?Equipment used: Rolling walker (2 wheels) ?Transfers: Sit to/from Stand ?Sit to Stand: Supervision ?  ?  ?  ?  ?  ?General transfer comment: Safe hand placement with standing. ?  ? ?Ambulation/Gait ?Ambulation/Gait assistance: Supervision ?Gait Distance (Feet): 120 Feet ?Assistive device: Rolling walker (2 wheels) ?Gait Pattern/deviations: Step-through pattern ?  ?  ?  ?  ? ? ?Stairs ?Stairs: Yes ?Stairs assistance: Min guard ?Stair Management: With walker, Forwards, No rails, Step to pattern ?Number of Stairs: 1 ?General stair comments: x2 attempts. Safe sequencing today with no LOB and correct use of RW. ? ? ?Wheelchair Mobility ?  ? ?Modified Rankin (Stroke Patients Only) ?  ? ? ?  ?Balance Overall balance assessment: Needs assistance ?Sitting-balance support: Bilateral upper extremity supported, Feet supported, Single extremity supported ?Sitting balance-Leahy Scale: Fair ?  ?  ?Standing balance support: Bilateral upper extremity supported, During functional activity, Reliant on assistive device for balance ?Standing balance-Leahy Scale: Fair ?Standing balance comment: Maintains UE's on RW for support ?  ?  ?  ?  ?  ?  ?  ?  ?  ?  ?  ?  ? ?  ?Cognition Arousal/Alertness: Awake/alert ?Behavior During Therapy: [facility] for tasks assessed/performed ?Overall Cognitive Status: Within Functional Limits for tasks assessed ?  ?  ?  ?  ?  ?  ?  ?  ?  ?  ?  ?  ?  ?  ?  ?  ?  ?  ?  ? ?  ?  Exercises General Exercises - Lower Extremity ?Long Arc Quad: AROM, Strengthening, Both, 15 reps, Seated ?Hip Flexion/Marching: AROM,  Strengthening, Seated, Both, 15 reps ?Toe Raises: AROM, Strengthening, Both, 15 reps, Seated ?Heel Raises: AROM, Strengthening, Seated, Both, 15 reps ? ?  ?General Comments   ?  ?  ? ?Pertinent Vitals/Pain Pain Assessment ?Pain Assessment: No/denies pain  ? ? ?Home Living   ?  ?  ?  ?  ?  ?  ?  ?  ?  ?   ?  ?Prior Function    ?  ?  ?   ? ?PT Goals (current goals can now be found in the care plan section) Acute Rehab PT Goals ?Patient Stated Goal: to go home ?PT Goal Formulation: With patient ?Time For Goal Achievement: 07/31/21 ?Potential to Achieve Goals: Good ?Progress towards PT goals: Progressing toward goals ? ?  ?Frequency ? ? ? Min 2X/week ? ? ? ?  ?PT Plan Current plan remains appropriate  ? ? ?Co-evaluation   ?  ?  ?  ?  ? ?  ?AM-PAC PT "6 Clicks" Mobility   ?Outcome Measure ? Help needed turning from your back to your side while in a flat bed without using bedrails?: A Little ?Help needed moving from lying on your back to sitting on the side of a flat bed without using bedrails?: A Little ?Help needed moving to and from a bed to a chair (including a wheelchair)?: A Little ?Help needed standing up from a chair using your arms (e.g., wheelchair or bedside chair)?: A Little ?Help needed to walk in hospital room?: A Little ?Help needed climbing 3-5 steps with a railing? : A Little ?6 Click Score: 18 ? ?  ?End of Session Equipment Utilized During Treatment: Gait belt ?Activity Tolerance: Patient tolerated treatment well ?Patient left: in chair;with call bell/phone within reach;with chair alarm set ?Nurse Communication: Mobility status ?PT Visit Diagnosis: Unsteadiness on feet (R26.81);Muscle weakness (generalized) (M62.81);Other abnormalities of gait and mobility (R26.89);Difficulty in walking, not elsewhere classified (R26.2) ?  ? ? ?Time: 9390-3009 ?PT Time Calculation (min) (ACUTE ONLY): 28 min ? ?Charges:  $Gait Training: 8-22 mins ?$Therapeutic Exercise: 8-22 mins          ?          ?Salem Caster. Fairly IV,  PT, DPT ?Physical Therapist- [place]  ?[facility]  ?07/19/2021, 3:33 PM ? ?

## 2021-07-19 NOTE — Hospital Course (Signed)
Erika Daniel is a 88 y.o. female with medical history significant for hypertension, osteoarthritis, basal cell carcinoma, who presented to the hospital because of generalized weakness.  She had been taking Lasix for leg swelling. ?  ?She was found to have AKI, acute hyponatremia and hypokalemia.  She was treated with IV fluids and potassium was repleted.   ?

## 2021-07-20 ENCOUNTER — Encounter: Payer: Self-pay | Admitting: Internal Medicine

## 2021-07-20 LAB — BASIC METABOLIC PANEL
Anion gap: 5 (ref 5–15)
BUN: 18 mg/dL (ref 8–23)
CO2: 27 mmol/L (ref 22–32)
Calcium: 8.5 mg/dL — ABNORMAL LOW (ref 8.9–10.3)
Chloride: 102 mmol/L (ref 98–111)
Creatinine, Ser: 0.72 mg/dL (ref 0.44–1.00)
GFR, Estimated: >60 mL/min (ref >60)
Glucose, Bld: 94 mg/dL (ref 70–99)
Potassium: 4 mmol/L (ref 3.5–5.1)
Sodium: 134 mmol/L — ABNORMAL LOW (ref 135–145)

## 2021-07-20 LAB — MAGNESIUM: Magnesium: 1.9 mg/dL (ref 1.7–2.4)

## 2021-07-20 MED ORDER — FUROSEMIDE 20 MG PO TABS: 20.0000 mg | ORAL_TABLET | Freq: Every day | ORAL | 1 refills | Status: AC | PRN

## 2021-07-20 NOTE — Progress Notes (Signed)
Blood pressure (!) 145/57, pulse 75, temperature 97.7 ?F (36.5 ?C), resp. rate 16, height '5\' 3"'$  (1.6 m), weight 64.3 kg, SpO2 96 %. ?IV removed site c/d/I pt d/c with all belongings and transported via w/c down to private car.  ?

## 2021-07-20 NOTE — Discharge Summary (Signed)
Physician Discharge Summary   Patient: Erika Daniel MRN: [id number] DOB: 06/07/1921  Admit date:     07/16/2021  Discharge date: 07/20/21  Discharge Physician: Ezekiel Slocumb   PCP: Dion Body, MD   Recommendations at discharge:   Follow-up with primary care in 1 to 2 weeks  Follow-up CMP/CBC/Mg in 1 to 2 weeks Follow-up on blood pressure control.  HCTZ and losartan were held at time of discharge to prevent hypotension.  Lasix was made as needed for leg swelling given patient presented with significant hyponatremia and hypokalemia.  Discharge Diagnoses: Principal Problem:   Generalized weakness Active Problems:   Benign essential HTN   Hyponatremia   GERD without esophagitis  Resolved Problems:   AKI (acute kidney injury) ([place])   Hypokalemia  Hospital Course: Erika Daniel is a 88 y.o. female with medical history significant for hypertension, osteoarthritis, basal cell carcinoma, who presented to the hospital because of generalized weakness.  She had been taking Lasix for leg swelling.   She was found to have AKI, acute hyponatremia and hypokalemia.  She was treated with IV fluids and potassium was repleted.    Assessment and Plan: * Generalized weakness Most likely due to significant electrolyte derangements.  PT and OT both recommended home health which was arranged by TOC.   Hyponatremia Suspect hypovolemic in the setting of diuretic therapy.  Sodium level improved with IV fluids -- Sodium today 134, stable off IV fluids --Continue to hold hctz -- Resume Lasix only as needed for leg swelling -- Repeat BMP at follow-up in 1 to 2 weeks  Benign essential HTN Diuretics were held. At discharge, Lasix was made as needed for swelling.  HCTZ and losartan were held at discharge to avoid hypotension as BPs have been controlled with these held. Continue verapamil.   AKI (acute kidney injury) (H[facility])-resolved as of 07/20/2021 Likely prerenal azotemia.   Resolved  with holding diuretics and providing IV hydration. Repeat BMP in 1 to 2 weeks at follow-up.  GERD without esophagitis Continue PPI  Hypokalemia-resolved as of 07/20/2021 Presented with K2.5.   Potassium has been replaced.   K today stable at 4.0.   Monitor BMP at follow-up and replace if needed.         Consultants: None Procedures performed: None Disposition: Home health Diet recommendation:  Cardiac diet DISCHARGE MEDICATION: Allergies as of 07/20/2021       Reactions   Bisphosphonates    Other reaction(s): Other (See Comments), Other (See Comments) Intolerance Intolerance   Codeine Other (See Comments)   Nervous feeling        Medication List     STOP taking these medications    aspirin EC 81 MG tablet   hydrochlorothiazide 12.5 MG tablet Commonly known as: HYDRODIURIL   losartan 50 MG tablet Commonly known as: COZAAR   ondansetron 4 MG disintegrating tablet Commonly known as: ZOFRAN-ODT       TAKE these medications    acetaminophen 500 MG tablet Commonly known as: TYLENOL Take 500 mg by mouth every 6 (six) hours as needed for mild pain or headache.   Biotin 5000 MCG Tabs Take 5,000 mcg by mouth daily.   furosemide 20 MG tablet Commonly known as: LASIX Take 1 tablet (20 mg total) by mouth daily as needed (leg swelling). What changed:  when to take this reasons to take this   multivitamin with minerals Tabs tablet Take 1 tablet by mouth daily.   pantoprazole 40 MG tablet Commonly known as:  PROTONIX Take 40 mg by mouth daily. What changed: Another medication with the same name was removed. Continue taking this medication, and follow the directions you see here.   verapamil 240 MG CR tablet Commonly known as: CALAN-SR Take 240 mg by mouth daily.        Discharge Exam: Filed Weights   07/16/21 2140 07/20/21 0520  Weight: 59 kg 64.3 kg   General exam: awake, alert, no acute distress HEENT: atraumatic, clear conjunctiva, anicteric  sclera, moist mucus membranes, hearing grossly normal  Respiratory system: CTAB, no wheezes, rales or rhonchi, normal respiratory effort. Cardiovascular system: normal S1/S2, RRR, no pedal edema.   Gastrointestinal system: soft, NT, ND, no HSM felt, +bowel sounds. Central nervous system: A&O x. no gross focal neurologic deficits, normal speech Extremities: moves all, no edema, normal tone Skin: dry, intact, normal temperature Psychiatry: normal mood, congruent affect, judgement and insight appear normal  Condition at discharge: stable  The results of significant diagnostics from this hospitalization (including imaging, microbiology, ancillary and laboratory) are listed below for reference.   Imaging Studies: No results found.  Microbiology: Results for orders placed or performed during the hospital encounter of 07/16/21  Resp Panel by RT-PCR (Flu A&B, Covid) Nasopharyngeal Swab     Status: None   Collection Time: 07/17/21 12:58 AM   Specimen: Nasopharyngeal Swab; Nasopharyngeal(NP) swabs in vial transport medium  Result Value Ref Range Status   SARS Coronavirus 2 by RT PCR NEGATIVE NEGATIVE Final    Comment: (NOTE) SARS-CoV-2 target nucleic acids are NOT DETECTED.  The SARS-CoV-2 RNA is generally detectable in upper respiratory specimens during the acute phase of infection. The lowest concentration of SARS-CoV-2 viral copies this assay can detect is 138 copies/mL. A negative result does not preclude SARS-Cov-2 infection and should not be used as the sole basis for treatment or other patient management decisions. A negative result may occur with  improper specimen collection/handling, submission of specimen other than nasopharyngeal swab, presence of viral mutation(s) within the areas targeted by this assay, and inadequate number of viral copies(<138 copies/mL). A negative result must be combined with clinical observations, patient history, and epidemiological information. The  expected result is Negative.  Fact Sheet for Patients:  EntrepreneurPulse.com.au  Fact Sheet for Healthcare Providers:  IncredibleEmployment.be  This test is no t yet approved or cleared by the [place] FDA and  has been authorized for detection and/or diagnosis of SARS-CoV-2 by FDA under an Emergency Use Authorization (EUA). This EUA will remain  in effect (meaning this test can be used) for the duration of the COVID-19 declaration under Section 564(b)(1) of the Act, 21 U.S.C.section 360bbb-3(b)(1), unless the authorization is terminated  or revoked sooner.       Influenza A by PCR NEGATIVE NEGATIVE Final   Influenza B by PCR NEGATIVE NEGATIVE Final    Comment: (NOTE) The Xpert Xpress SARS-CoV-2/FLU/RSV plus assay is intended as an aid in the diagnosis of influenza from Nasopharyngeal swab specimens and should not be used as a sole basis for treatment. Nasal washings and aspirates are unacceptable for Xpert Xpress SARS-CoV-2/FLU/RSV testing.  Fact Sheet for Patients: EntrepreneurPulse.com.au  Fact Sheet for Healthcare Providers: IncredibleEmployment.be  This test is not yet approved or cleared by the [place] FDA and has been authorized for detection and/or diagnosis of SARS-CoV-2 by FDA under an Emergency Use Authorization (EUA). This EUA will remain in effect (meaning this test can be used) for the duration of the COVID-19 declaration under Section 564(b)(1) of the  Act, 21 U.S.C. section 360bbb-3(b)(1), unless the authorization is terminated or revoked.  Performed at [facility], [address] [place], [place] [address]     Labs: CBC: Recent Labs  Lab 07/16/21 2149 07/17/21 0241 07/17/21 0427  WBC 10.6* 10.4 10.1  HGB 13.3 12.9 13.1  HCT 37.0 35.6* 36.3  MCV 84.5 83.8 83.8  PLT 555* 520* 175*   Basic Metabolic Panel: Recent Labs  Lab 07/16/21 2149  07/17/21 0241 07/17/21 0427 07/17/21 0646 07/17/21 1226 07/18/21 0319 07/19/21 0341 07/20/21 0452  NA 118*   < > 124* 126* 126* 129* 133* 134*  K 2.5*  --  2.8*  --   --  3.0* 4.4 4.0  CL 79*  --  85*  --   --  92* 101 102  CO2 27  --  26  --   --  '28 27 27  '$ GLUCOSE 117*  --  113*  --   --  103* 100* 94  BUN 22  --  19  --   --  '21 18 18  '$ CREATININE 1.27*  --  0.94  --   --  0.81 0.82 0.72  CALCIUM 9.3  --  9.2  --   --  8.2* 8.7* 8.5*  MG 1.9  --   --   --   --  1.8 1.9 1.9   < > = values in this interval not displayed.   Liver Function Tests: Recent Labs  Lab 07/16/21 2149 07/17/21 0427  AST 47* 44*  ALT 22 22  ALKPHOS 78 57  BILITOT 1.5* 1.1  PROT 7.4 6.8  ALBUMIN 4.3 3.9   CBG: No results for input(s): GLUCAP in the last 168 hours.  Discharge time spent: less than 30 minutes.  Signed: Ezekiel Slocumb, DO [facility] 07/20/2021

## 2021-07-20 NOTE — Care Management Important Message (Signed)
Important Message ? ?Patient Details  ?Name: Erika Daniel ?MRN: [id number] ?Date of Birth: 1921/06/15 ? ? ?Medicare Important Message Given:  Yes ? ? ? ? ?[name] ?07/20/2021, 10:48 AM ?

## 2021-09-21 ENCOUNTER — Encounter: Payer: Self-pay | Admitting: Podiatry

## 2021-09-21 ENCOUNTER — Ambulatory Visit: Payer: Medicare Other | Admitting: Podiatry

## 2021-09-21 DIAGNOSIS — B351 Tinea unguium: Secondary | ICD-10-CM | POA: Diagnosis not present

## 2021-09-21 DIAGNOSIS — M79676 Pain in unspecified toe(s): Secondary | ICD-10-CM | POA: Diagnosis not present

## 2021-09-21 DIAGNOSIS — N183 Chronic kidney disease, stage 3 unspecified: Secondary | ICD-10-CM

## 2021-09-21 NOTE — Progress Notes (Signed)
This patient returns to my office for at risk foot care.  This patient requires this care by a professional since this patient will be at risk due to having CKD.  This patient is unable to cut nails herself since the patient cannot reach her nails.These nails are painful walking and wearing shoes.  Patient is brought to the office by her niece.  This patient presents for at risk foot care today.  General Appearance  Alert, conversant and in no acute stress.  Vascular  Dorsalis pedis and posterior tibial  pulses are weakly palpable  bilaterally.  Capillary return is within normal limits  bilaterally. Temperature is within normal limits  bilaterally.  Neurologic  Senn-Weinstein monofilament wire test within normal limits  bilaterally. Muscle power within normal limits bilaterally.  Nails Thick disfigured discolored nails with subungual debris  from hallux to fifth toes bilaterally. No evidence of bacterial infection or drainage bilaterally.  Orthopedic  No limitations of motion  feet .  No crepitus or effusions noted.  No bony pathology or digital deformities noted.  Skin  normotropic skin with no porokeratosis noted bilaterally.  No signs of infections or ulcers noted.     Onychomycosis  Pain in right toes  Pain in left toes  Consent was obtained for treatment procedures.   Mechanical debridement of nails 1-5  bilaterally performed with a nail nipper.  Filed with dremel without incident.    Return office visit    3 months                  Told patient to return for periodic foot care and evaluation due to potential at risk complications.   Izea Livolsi DPM   

## 2021-12-25 ENCOUNTER — Ambulatory Visit: Payer: Medicare Other | Admitting: Podiatry

## 2021-12-25 ENCOUNTER — Encounter: Payer: Self-pay | Admitting: Podiatry

## 2021-12-25 DIAGNOSIS — M79676 Pain in unspecified toe(s): Secondary | ICD-10-CM | POA: Diagnosis not present

## 2021-12-25 DIAGNOSIS — B351 Tinea unguium: Secondary | ICD-10-CM

## 2021-12-25 DIAGNOSIS — N183 Chronic kidney disease, stage 3 unspecified: Secondary | ICD-10-CM

## 2021-12-25 NOTE — Progress Notes (Signed)
This patient returns to my office for at risk foot care.  This patient requires this care by a professional since this patient will be at risk due to having CKD.  This patient is unable to cut nails herself since the patient cannot reach her nails.These nails are painful walking and wearing shoes.  Patient is brought to the office by her niece.  This patient presents for at risk foot care today.  General Appearance  Alert, conversant and in no acute stress.  Vascular  Dorsalis pedis and posterior tibial  pulses are weakly palpable  bilaterally.  Capillary return is within normal limits  bilaterally. Temperature is within normal limits  bilaterally.  Neurologic  Senn-Weinstein monofilament wire test within normal limits  bilaterally. Muscle power within normal limits bilaterally.  Nails Thick disfigured discolored nails with subungual debris  from hallux to fifth toes bilaterally. No evidence of bacterial infection or drainage bilaterally.  Orthopedic  No limitations of motion  feet .  No crepitus or effusions noted.  No bony pathology or digital deformities noted.  Skin  normotropic skin with no porokeratosis noted bilaterally.  No signs of infections or ulcers noted.     Onychomycosis  Pain in right toes  Pain in left toes  Consent was obtained for treatment procedures.   Mechanical debridement of nails 1-5  bilaterally performed with a nail nipper.  Filed with dremel without incident.    Return office visit    3 months                  Told patient to return for periodic foot care and evaluation due to potential at risk complications.   Mohan Erven DPM   

## 2022-04-12 ENCOUNTER — Encounter: Payer: Self-pay | Admitting: Podiatry

## 2022-04-12 ENCOUNTER — Ambulatory Visit: Payer: Medicare Other | Admitting: Podiatry

## 2022-04-12 DIAGNOSIS — B351 Tinea unguium: Secondary | ICD-10-CM

## 2022-04-12 DIAGNOSIS — N183 Chronic kidney disease, stage 3 unspecified: Secondary | ICD-10-CM

## 2022-04-12 DIAGNOSIS — M79676 Pain in unspecified toe(s): Secondary | ICD-10-CM

## 2022-04-12 NOTE — Progress Notes (Signed)
This patient returns to my office for at risk foot care.  This patient requires this care by a professional since this patient will be at risk due to having CKD.  This patient is unable to cut nails herself since the patient cannot reach her nails.These nails are painful walking and wearing shoes.  Patient is brought to the office by her niece.  This patient presents for at risk foot care today.  General Appearance  Alert, conversant and in no acute stress.  Vascular  Dorsalis pedis and posterior tibial  pulses are weakly palpable  bilaterally.  Capillary return is within normal limits  bilaterally. Temperature is within normal limits  bilaterally.  Neurologic  Senn-Weinstein monofilament wire test within normal limits  bilaterally. Muscle power within normal limits bilaterally.  Nails Thick disfigured discolored nails with subungual debris  from hallux to fifth toes bilaterally. No evidence of bacterial infection or drainage bilaterally.  Orthopedic  No limitations of motion  feet .  No crepitus or effusions noted.  No bony pathology or digital deformities noted.  Skin  normotropic skin with no porokeratosis noted bilaterally.  No signs of infections or ulcers noted.     Onychomycosis  Pain in right toes  Pain in left toes  Consent was obtained for treatment procedures.   Mechanical debridement of nails 1-5  bilaterally performed with a nail nipper.  Filed with dremel without incident.    Return office visit    3 months                  Told patient to return for periodic foot care and evaluation due to potential at risk complications.   Trysta Showman DPM   

## 2022-07-19 ENCOUNTER — Ambulatory Visit: Payer: Medicare Other | Admitting: Podiatry

## 2022-07-19 ENCOUNTER — Encounter: Payer: Self-pay | Admitting: Podiatry

## 2022-07-19 DIAGNOSIS — N183 Chronic kidney disease, stage 3 unspecified: Secondary | ICD-10-CM

## 2022-07-19 DIAGNOSIS — M79676 Pain in unspecified toe(s): Secondary | ICD-10-CM | POA: Diagnosis not present

## 2022-07-19 DIAGNOSIS — B351 Tinea unguium: Secondary | ICD-10-CM | POA: Diagnosis not present

## 2022-07-19 NOTE — Progress Notes (Signed)
This patient returns to my office for at risk foot care.  This patient requires this care by a professional since this patient will be at risk due to having CKD.  This patient is unable to cut nails herself since the patient cannot reach her nails.These nails are painful walking and wearing shoes.  Patient is brought to the office by her niece.  This patient presents for at risk foot care today.  General Appearance  Alert, conversant and in no acute stress.  Vascular  Dorsalis pedis and posterior tibial  pulses are weakly palpable  bilaterally.  Capillary return is within normal limits  bilaterally. Temperature is within normal limits  bilaterally.  Neurologic  Senn-Weinstein monofilament wire test within normal limits  bilaterally. Muscle power within normal limits bilaterally.  Nails Thick disfigured discolored nails with subungual debris  from hallux to fifth toes bilaterally. No evidence of bacterial infection or drainage bilaterally.  Orthopedic  No limitations of motion  feet .  No crepitus or effusions noted.  No bony pathology or digital deformities noted.  Skin  normotropic skin with no porokeratosis noted bilaterally.  No signs of infections or ulcers noted.     Onychomycosis  Pain in right toes  Pain in left toes  Consent was obtained for treatment procedures.   Mechanical debridement of nails 1-5  bilaterally performed with a nail nipper.  Filed with dremel without incident.    Return office visit    3 months                  Told patient to return for periodic foot care and evaluation due to potential at risk complications.   Gardiner Barefoot DPM

## 2022-08-31 DIAGNOSIS — R7303 Prediabetes: Secondary | ICD-10-CM | POA: Insufficient documentation

## 2022-10-19 ENCOUNTER — Ambulatory Visit: Payer: Medicare Other | Admitting: Podiatry

## 2022-11-05 ENCOUNTER — Ambulatory Visit: Payer: Medicare Other | Admitting: Podiatry

## 2022-11-05 ENCOUNTER — Encounter: Payer: Self-pay | Admitting: Podiatry

## 2022-11-05 VITALS — BP 164/73 | HR 83

## 2022-11-05 DIAGNOSIS — M79676 Pain in unspecified toe(s): Secondary | ICD-10-CM

## 2022-11-05 DIAGNOSIS — N183 Chronic kidney disease, stage 3 unspecified: Secondary | ICD-10-CM | POA: Diagnosis not present

## 2022-11-05 DIAGNOSIS — B351 Tinea unguium: Secondary | ICD-10-CM | POA: Diagnosis not present

## 2022-11-05 NOTE — Progress Notes (Unsigned)
  Subjective:  Patient ID: Erika Daniel, female    DOB: April 29, 1922,  MRN: [id number]  Erika Daniel presents to clinic today for at risk foot care with h/o CKD and painful elongated mycotic toenails 1-5 bilaterally which are tender when wearing enclosed shoe gear. Pain is relieved with periodic professional debridement.  Chief Complaint  Patient presents with   Nail Problem    "Toenails"   New problem(s): None.   PCP is Marisue Ivan, MD.  Allergies  Allergen Reactions   Bisphosphonates     Other reaction(s): Other (See Comments), Other (See Comments) Intolerance Intolerance    Codeine Other (See Comments)    Nervous feeling   Review of Systems: Negative except as noted in the HPI.  Objective: No changes noted in today's physical examination. There were no vitals filed for this visit. Erika Daniel is a pleasant 89 y.o. female thin build in NAD. AAO x 3.  Vascular Examination: CFT <3 seconds b/l. DP/PT pulses faintly palpable b/l. Skin temperature gradient warm to warm b/l. No pain with calf compression. No ischemia or gangrene. No cyanosis or clubbing noted b/l. {jgvascular:23595}   Neurological Examination: Sensation grossly intact b/l with 10 gram monofilament. Vibratory sensation intact b/l.   Dermatological Examination: Pedal skin warm and supple b/l.   No open wounds. No interdigital macerations.  Toenails 1-5 b/l thick, discolored, elongated with subungual debris and pain on dorsal palpation.    {jgderm:23598}  Musculoskeletal Examination: Muscle strength 5/5 to b/l LE. Utilizes walker for ambulation assistance.  Radiographs: None  Assessment/Plan: 1. Pain due to onychomycosis of toenail   2. Stage 3 chronic kidney disease, unspecified whether stage 3a or 3b CKD (HCC)     No orders of the defined types were placed in this encounter.   None -Patient's family member present. All questions/concerns addressed on today's visit. -Consent given for  treatment as described below: -Examined patient. -Patient to continue soft, supportive shoe gear daily. -Toenails 1-5 b/l were debrided in length and girth with sterile nail nippers and dremel without iatrogenic bleeding.  -Patient/POA to call should there be question/concern in the interim.   Return in about 3 months (around 02/05/2023).  Freddie Breech, DPM

## 2023-01-31 IMAGING — US US ABDOMEN COMPLETE
1 series · 13 of 25 positions shown · non-contrast
Comparison: Report from CT abdomen 03/05/1997

CLINICAL DATA: Nausea and vomiting for the last 2 weeks

EXAM:
ABDOMEN ULTRASOUND COMPLETE

[Series 1: us abdomen complete · 0.15mm/px · 118 acquisitions, 13 frames shown]
[im 1/118]
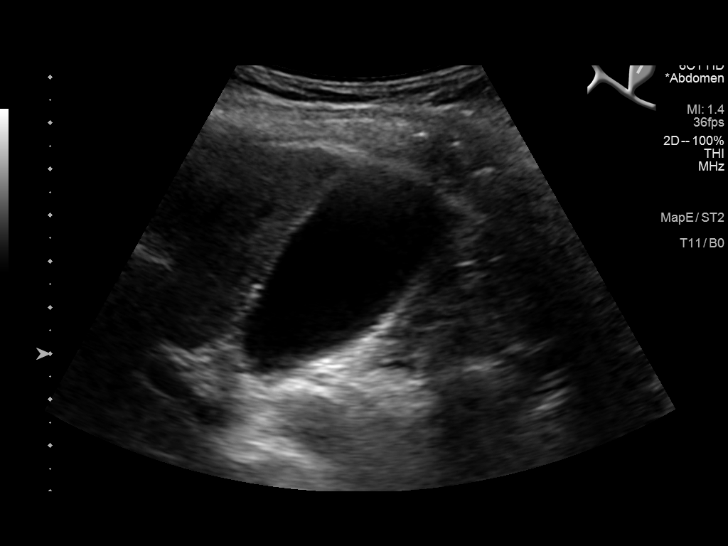
[im 10/118]
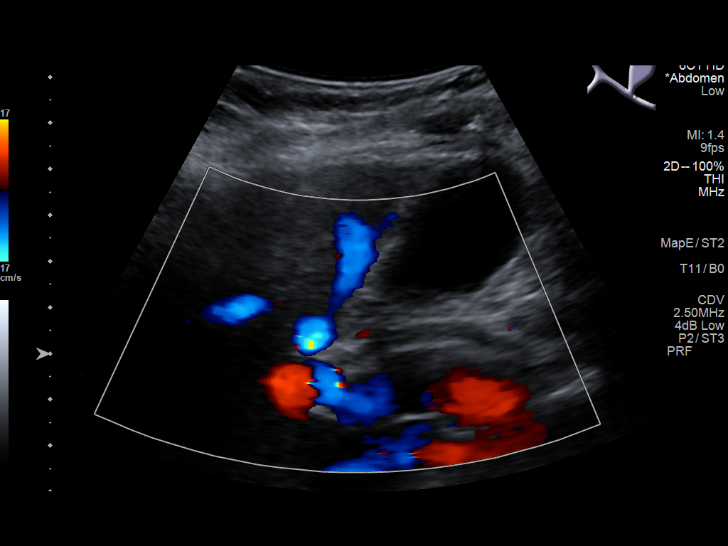
[im 20/118]
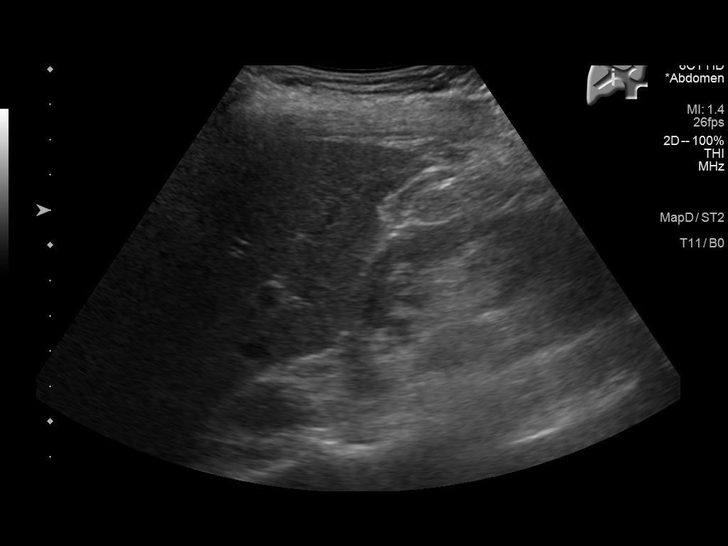
[im 30/118]
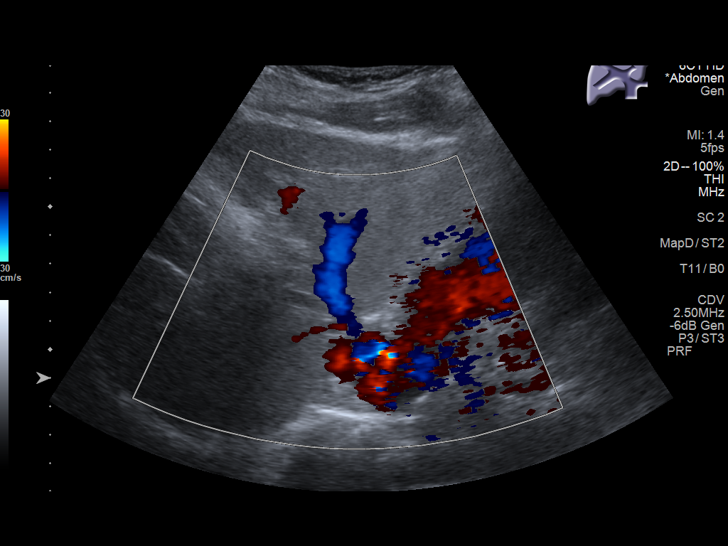
[im 40/118]
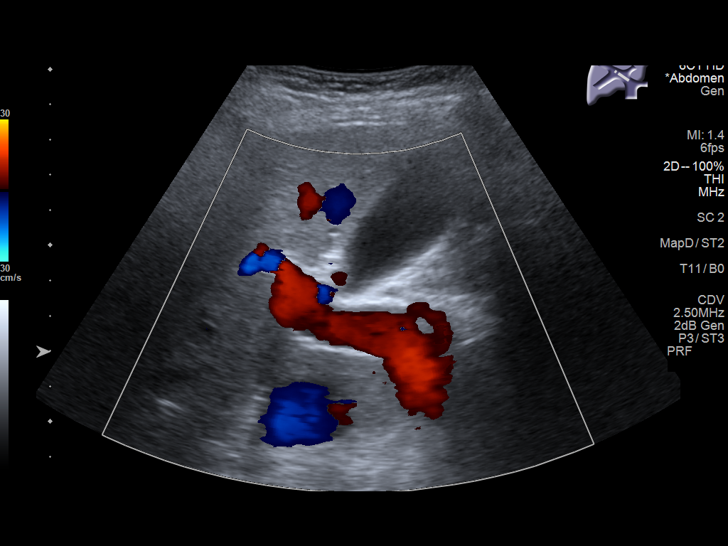
[im 49/118]
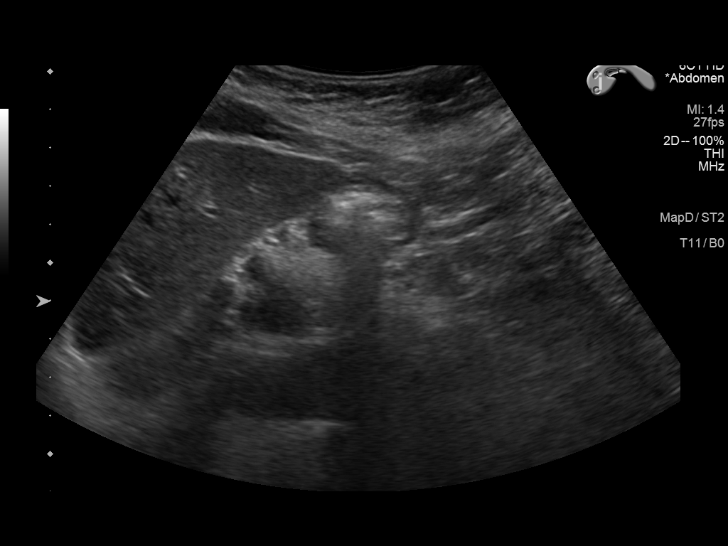
[im 59/118]
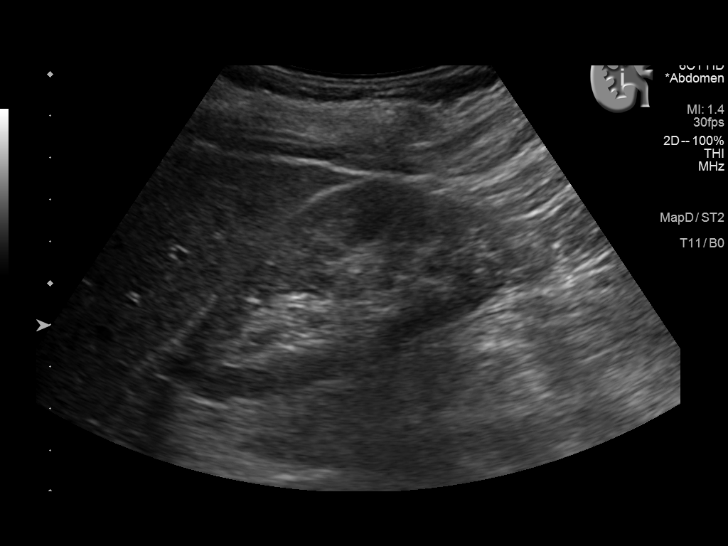
[im 69/118]
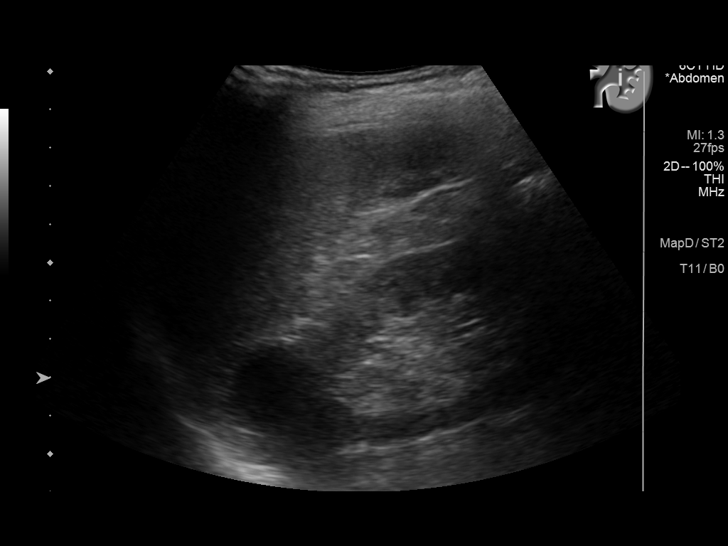
[im 79/118]
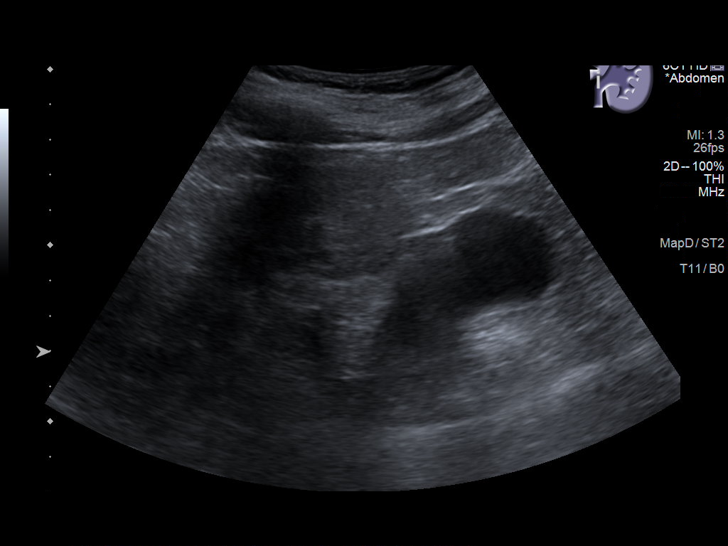
[im 88/118]
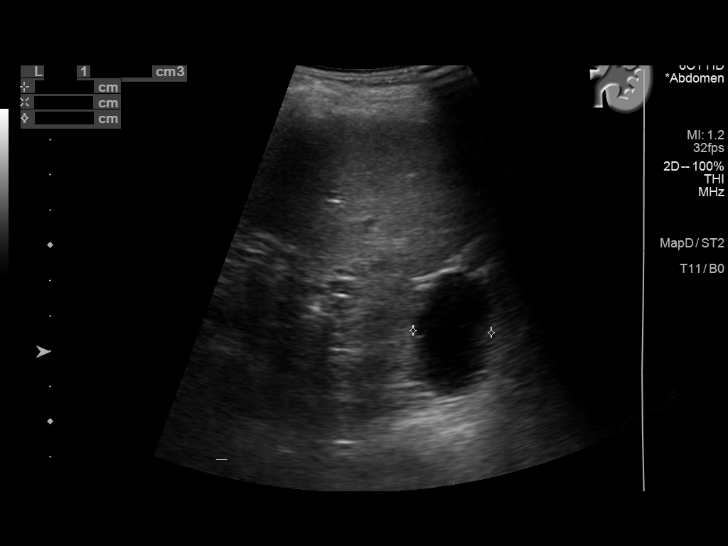
[im 98/118]
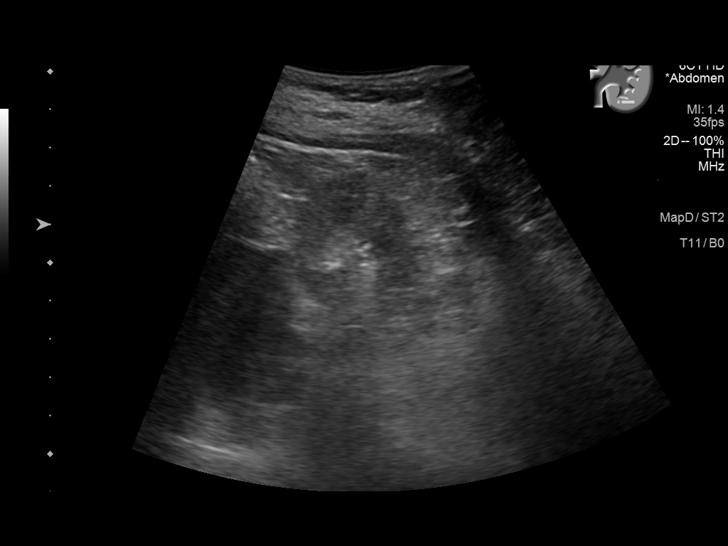
[im 108/118]
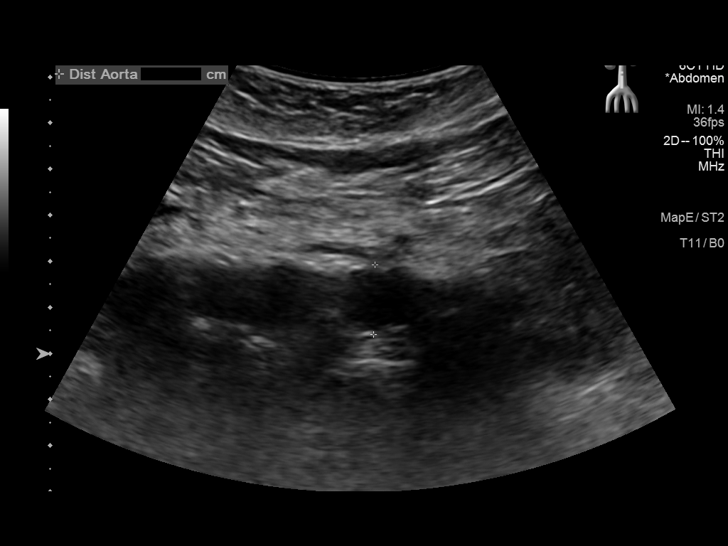
[im 118/118]
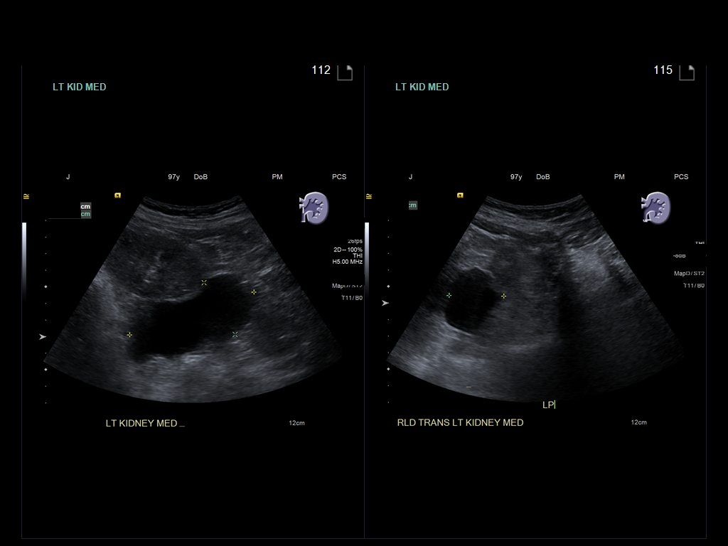

[13 of 25 positions shown; findings below may reference images not displayed]

FINDINGS: Gallbladder: No gallstones or wall thickening visualized. No
sonographic Murphy sign noted by sonographer.

Common bile duct: Diameter: 0.4 cm

Liver: No focal lesion identified. Within normal limits in
parenchymal echogenicity. Portal vein is patent on color Doppler
imaging with normal direction of blood flow towards the liver.

IVC: No abnormality visualized.

Pancreas: Visualized portion unremarkable. Pancreatic tail not seen
due to overlying bowel gas

Spleen: Size and appearance within normal limits.

Right Kidney: Length: 8.4 cm. Echogenicity within normal limits. No
hydronephrosis. No solid mass identified.

Left Kidney: Length: 8.5 cm. Echogenicity within normal limits. No
solid mass or hydronephrosis. A 2.6 by 3.7 cm cystic lesion
potentially with an internal septation is present along the left
kidney upper pole. Medial to the left kidney there is a 7.9 by
by 3.3 cm cystic lesion which probably correlates with the lesion
adjacent to the pancreatic tail discussed on CT scans from the late
1990s.

Abdominal aorta: No aneurysm visualized. Atherosclerotic
calcifications noted.

Other findings: None.
IMPRESSION: 1. 2.6 by 3.7 cm cystic lesion of the left kidney upper pole has a
single internal septation and accordingly is complex but
demonstrates no nodularity or worrisome elements.
2. A 7.9 by 3.7 by 3.3 cm cystic lesion medial to the left kidney
probably correlates with the pancreatic tail cystic lesion discussed
on multiple CT scans in [REDACTED], on those remote prior CT scans
this was reference does measuring 3 by 4.5 cm on axial slices. This
could represent a pancreatic pseudocyst or intraductal papillary
mucinous neoplasm, but given the patient's age and the long term
chronicity, further workup is probably not advised.
3.  Aortic Atherosclerosis ([id number]).

## 2023-02-14 ENCOUNTER — Ambulatory Visit: Payer: Medicare Other | Admitting: Podiatry

## 2023-02-14 ENCOUNTER — Encounter: Payer: Self-pay | Admitting: Podiatry

## 2023-02-14 DIAGNOSIS — M79676 Pain in unspecified toe(s): Secondary | ICD-10-CM

## 2023-02-14 DIAGNOSIS — B351 Tinea unguium: Secondary | ICD-10-CM

## 2023-02-18 NOTE — Progress Notes (Signed)
Subjective:  Patient ID: Erika Daniel, female    DOB: 1921-06-30,  MRN: [id number]  89 y.o. female presents at risk foot care with h/o CKD and painful thick toenails that are difficult to trim. Pain interferes with ambulation. Aggravating factors include wearing enclosed shoe gear. Pain is relieved with periodic professional debridement.  Chief Complaint  Patient presents with   RFC    RFC    New problem(s): None   PCP is Marisue Ivan, MD.  Allergies  Allergen Reactions   Bisphosphonates     Other reaction(s): Other (See Comments), Other (See Comments) Intolerance Intolerance    Codeine Other (See Comments)    Nervous feeling    Review of Systems: Negative except as noted in the HPI.   Objective:  Erika Daniel is a pleasant 89 y.o. female WD, WN in NAD.Marland Kitchen AAO x 3.  Vascular Examination: CFT <3 seconds b/l. DP/PT pulses faintly palpable b/l. Pedal hair absent b/l. Skin temperature gradient warm to warm b/l. No pain with calf compression. No ischemia or gangrene. No cyanosis or clubbing noted b/l.    Neurological Examination: Sensation grossly intact b/l with 10 gram monofilament.   Dermatological Examination: Pedal skin warm and supple b/l.   No open wounds. No interdigital macerations.  Toenails 1-5 b/l thick, discolored, elongated with subungual debris and pain on dorsal palpation.    No corns, calluses nor porokeratotic lesions noted.  Musculoskeletal Examination: Muscle strength 5/5 to all lower extremity muscle groups bilaterally. No pain, crepitus or joint limitation noted with ROM bilateral LE. Utilizes walker for ambulation assistance.  Radiographs: None  Last A1c:       No data to display         Assessment:   1. Pain due to onychomycosis of toenail    Plan:  -Patient was evaluated and treated. All patient's and/or POA's questions/concerns answered on today's visit. -Continue supportive shoe gear daily. -Mycotic toenails 1-5  bilaterally were debrided in length and girth with sterile nail nippers and dremel without incident. -Patient/POA to call should there be question/concern in the interim.  Return in about 3 months (around 05/17/2023).  Freddie Breech, DPM

## 2023-02-20 ENCOUNTER — Ambulatory Visit: Payer: Medicare Other | Admitting: Dermatology

## 2023-04-17 ENCOUNTER — Inpatient Hospital Stay: Payer: Medicare Other

## 2023-04-17 ENCOUNTER — Other Ambulatory Visit: Payer: Self-pay

## 2023-04-17 ENCOUNTER — Emergency Department: Payer: Medicare Other

## 2023-04-17 ENCOUNTER — Inpatient Hospital Stay
Admission: EM | Admit: 2023-04-17 | Discharge: 2023-04-22 | DRG: 552 | Disposition: A | Discharge status: Skilled Nursing Facility | Payer: Medicare Other | Attending: Internal Medicine | Admitting: Internal Medicine

## 2023-04-17 DIAGNOSIS — Z85828 Personal history of other malignant neoplasm of skin: Secondary | ICD-10-CM

## 2023-04-17 DIAGNOSIS — Z8042 Family history of malignant neoplasm of prostate: Secondary | ICD-10-CM

## 2023-04-17 DIAGNOSIS — S22070A Wedge compression fracture of T9-T10 vertebra, initial encounter for closed fracture: Secondary | ICD-10-CM | POA: Diagnosis not present

## 2023-04-17 DIAGNOSIS — K219 Gastro-esophageal reflux disease without esophagitis: Secondary | ICD-10-CM | POA: Diagnosis present

## 2023-04-17 DIAGNOSIS — Z79899 Other long term (current) drug therapy: Secondary | ICD-10-CM | POA: Diagnosis not present

## 2023-04-17 DIAGNOSIS — E878 Other disorders of electrolyte and fluid balance, not elsewhere classified: Secondary | ICD-10-CM | POA: Diagnosis present

## 2023-04-17 DIAGNOSIS — Y92009 Unspecified place in unspecified non-institutional (private) residence as the place of occurrence of the external cause: Secondary | ICD-10-CM

## 2023-04-17 DIAGNOSIS — R296 Repeated falls: Secondary | ICD-10-CM | POA: Diagnosis not present

## 2023-04-17 DIAGNOSIS — S22080D Wedge compression fracture of T11-T12 vertebra, subsequent encounter for fracture with routine healing: Secondary | ICD-10-CM

## 2023-04-17 DIAGNOSIS — N1831 Chronic kidney disease, stage 3a: Secondary | ICD-10-CM | POA: Diagnosis present

## 2023-04-17 DIAGNOSIS — N183 Chronic kidney disease, stage 3 unspecified: Secondary | ICD-10-CM | POA: Diagnosis present

## 2023-04-17 DIAGNOSIS — Z66 Do not resuscitate: Secondary | ICD-10-CM | POA: Diagnosis present

## 2023-04-17 DIAGNOSIS — I10 Essential (primary) hypertension: Secondary | ICD-10-CM

## 2023-04-17 DIAGNOSIS — E871 Hypo-osmolality and hyponatremia: Secondary | ICD-10-CM | POA: Diagnosis not present

## 2023-04-17 DIAGNOSIS — S22089A Unspecified fracture of T11-T12 vertebra, initial encounter for closed fracture: Secondary | ICD-10-CM

## 2023-04-17 DIAGNOSIS — I129 Hypertensive chronic kidney disease with stage 1 through stage 4 chronic kidney disease, or unspecified chronic kidney disease: Secondary | ICD-10-CM | POA: Diagnosis present

## 2023-04-17 DIAGNOSIS — W19XXXA Unspecified fall, initial encounter: Secondary | ICD-10-CM

## 2023-04-17 DIAGNOSIS — Z8249 Family history of ischemic heart disease and other diseases of the circulatory system: Secondary | ICD-10-CM | POA: Diagnosis not present

## 2023-04-17 DIAGNOSIS — S22071A Stable burst fracture of T9-T10 vertebra, initial encounter for closed fracture: Secondary | ICD-10-CM | POA: Diagnosis present

## 2023-04-17 DIAGNOSIS — S22000A Wedge compression fracture of unspecified thoracic vertebra, initial encounter for closed fracture: Principal | ICD-10-CM

## 2023-04-17 DIAGNOSIS — E876 Hypokalemia: Secondary | ICD-10-CM | POA: Diagnosis not present

## 2023-04-17 DIAGNOSIS — K5900 Constipation, unspecified: Secondary | ICD-10-CM | POA: Diagnosis present

## 2023-04-17 DIAGNOSIS — S22080A Wedge compression fracture of T11-T12 vertebra, initial encounter for closed fracture: Secondary | ICD-10-CM

## 2023-04-17 LAB — CBC WITH DIFFERENTIAL/PLATELET
Abs Immature Granulocytes: 0.21 10*3/uL — ABNORMAL HIGH (ref 0.00–0.07)
Basophils Absolute: 0.1 10*3/uL (ref 0.0–0.1)
Basophils Relative: 0 %
Eosinophils Absolute: 0.1 10*3/uL (ref 0.0–0.5)
Eosinophils Relative: 1 %
HCT: 42.1 % (ref 36.0–46.0)
Hemoglobin: 14.1 g/dL (ref 12.0–15.0)
Immature Granulocytes: 1 %
Lymphocytes Relative: 5 %
Lymphs Abs: 0.8 10*3/uL (ref 0.7–4.0)
MCH: 30.8 pg (ref 26.0–34.0)
MCHC: 33.5 g/dL (ref 30.0–36.0)
MCV: 91.9 fL (ref 80.0–100.0)
Monocytes Absolute: 1.2 10*3/uL — ABNORMAL HIGH (ref 0.1–1.0)
Monocytes Relative: 8 %
Neutro Abs: 13 10*3/uL — ABNORMAL HIGH (ref 1.7–7.7)
Neutrophils Relative %: 85 %
Platelets: 569 10*3/uL — ABNORMAL HIGH (ref 150–400)
RBC: 4.58 MIL/uL (ref 3.87–5.11)
RDW: 13.2 % (ref 11.5–15.5)
WBC: 15.4 10*3/uL — ABNORMAL HIGH (ref 4.0–10.5)
nRBC: 0 % (ref 0.0–0.2)

## 2023-04-17 LAB — URINALYSIS, ROUTINE W REFLEX MICROSCOPIC
Bilirubin Urine: NEGATIVE
Glucose, UA: NEGATIVE mg/dL
Hgb urine dipstick: NEGATIVE
Ketones, ur: NEGATIVE mg/dL
Leukocytes,Ua: NEGATIVE
Nitrite: NEGATIVE
Protein, ur: NEGATIVE mg/dL
Specific Gravity, Urine: 1.008 (ref 1.005–1.030)
pH: 8 (ref 5.0–8.0)

## 2023-04-17 LAB — BASIC METABOLIC PANEL
Anion gap: 7 (ref 5–15)
BUN: 14 mg/dL (ref 8–23)
CO2: 28 mmol/L (ref 22–32)
Calcium: 9.4 mg/dL (ref 8.9–10.3)
Chloride: 100 mmol/L (ref 98–111)
Creatinine, Ser: 0.79 mg/dL (ref 0.44–1.00)
GFR, Estimated: >60 mL/min (ref >60)
Glucose, Bld: 109 mg/dL — ABNORMAL HIGH (ref 70–99)
Potassium: 4 mmol/L (ref 3.5–5.1)
Sodium: 135 mmol/L (ref 135–145)

## 2023-04-17 LAB — CK: Total CK: 62 U/L (ref 38–234)

## 2023-04-17 LAB — TROPONIN I (HIGH SENSITIVITY): Troponin I (High Sensitivity): 5 ng/L (ref <18)

## 2023-04-17 MED ORDER — ACETAMINOPHEN 500 MG PO TABS
1000.0000 mg | ORAL_TABLET | Freq: Once | ORAL | Status: AC
Start: 2023-04-17 — End: 2023-04-17
  Administered 2023-04-17: 1000 mg via ORAL
  Filled 2023-04-17: qty 2

## 2023-04-17 MED ORDER — LIDOCAINE 5 % EX PTCH
1.0000 | MEDICATED_PATCH | Freq: Once | CUTANEOUS | Status: AC
  Administered 2023-04-17: 1 via TRANSDERMAL
  Filled 2023-04-17: qty 1

## 2023-04-17 MED ORDER — ONDANSETRON HCL 4 MG PO TABS
4.0000 mg | ORAL_TABLET | Freq: Four times a day (QID) | ORAL | Status: DC | PRN
  Administered 2023-04-18 – 2023-04-19 (×2): 4 mg via ORAL
  Filled 2023-04-17 (×2): qty 1

## 2023-04-17 MED ORDER — MORPHINE SULFATE (PF) 2 MG/ML IV SOLN
2.0000 mg | INTRAVENOUS | Status: DC | PRN
Start: 2023-04-17 — End: 2023-04-22
  Administered 2023-04-20 – 2023-04-21 (×5): 2 mg via INTRAVENOUS
  Filled 2023-04-17 (×5): qty 1

## 2023-04-17 MED ORDER — OXYCODONE HCL 5 MG PO TABS
2.5000 mg | ORAL_TABLET | Freq: Once | ORAL | Status: AC
  Administered 2023-04-17: 2.5 mg via ORAL
  Filled 2023-04-17: qty 1

## 2023-04-17 MED ORDER — ENOXAPARIN SODIUM 40 MG/0.4ML IJ SOSY
40.0000 mg | PREFILLED_SYRINGE | INTRAMUSCULAR | Status: DC
Start: 2023-04-17 — End: 2023-04-18
  Administered 2023-04-17: 40 mg via SUBCUTANEOUS
  Filled 2023-04-17: qty 0.4

## 2023-04-17 MED ORDER — ONDANSETRON HCL 4 MG/2ML IJ SOLN
4.0000 mg | Freq: Four times a day (QID) | INTRAMUSCULAR | Status: DC | PRN
  Administered 2023-04-17: 4 mg via INTRAVENOUS
  Filled 2023-04-17: qty 2

## 2023-04-17 MED ORDER — HYDRALAZINE HCL 20 MG/ML IJ SOLN
10.0000 mg | INTRAMUSCULAR | Status: DC | PRN
  Administered 2023-04-20: 10 mg via INTRAVENOUS
  Filled 2023-04-17 (×2): qty 1

## 2023-04-17 NOTE — Assessment & Plan Note (Addendum)
Positive fall at home concern for mechanical etiology Patient denies any weakness dizziness prior to event Noted recurrent back pain with T12 compression fracture on imaging Pain control PT OT evaluation Dr. Myer Haff with neurosurgery consulted-likely not surgical candidate Anticipate placement versus rehab Fall precautions

## 2023-04-17 NOTE — ED Notes (Signed)
Pt resting comfortably in bed

## 2023-04-17 NOTE — Assessment & Plan Note (Signed)
Noted close T12 fracture on imaging with recent fall Unclear chronicity on imaging Dr. Myer Haff with neurosurgery consulted Will otherwise plan for pain control, PT OT evaluation and likely rehab Likely not a surgical candidate Monitor

## 2023-04-17 NOTE — ED Provider Notes (Signed)
[facility] Provider Note    Event Date/Time   First MD Initiated Contact with Patient 04/17/23 1023     (approximate)   History   Fall   HPI  Erika Daniel is a 89 y.o. female with hypertension osteoarthritis he comes in with concerns for a fall.  Unclear from EMS exactly when the fall occurred.  It was unwitnessed.  She has some home health nurse aide come in and she was found laying down on the couch and I guess had reported having a had a fall.  Patient reports that she stated that she fell just an hour ago.  Does not report prolonged downtime.  She denies any chest pain, shortness of breath.pt denies passing out.   Physical Exam   Triage Vital Signs: ED Triage Vitals  Encounter Vitals Group     BP 04/17/23 1019 (!) 214/88     Systolic BP Percentile --      Diastolic BP Percentile --      Pulse Rate 04/17/23 1019 72     Resp 04/17/23 1019 18     Temp 04/17/23 1019 97.8 F (36.6 C)     Temp Source 04/17/23 1019 Oral     SpO2 04/17/23 1019 93 %     Weight 04/17/23 1015 130 lb 4.7 oz (59.1 kg)     Height 04/17/23 1015 5\' 3"  (1.6 m)     Head Circumference --      Peak Flow --      Pain Score 04/17/23 1014 8     Pain Loc --      Pain Education --      Exclude from Growth Chart --     Most recent vital signs: Vitals:   04/17/23 1019  BP: (!) 214/88  Pulse: 72  Resp: 18  Temp: 97.8 F (36.6 C)  SpO2: 93%     General: Awake, no distress.  CV:  Good peripheral perfusion.  Resp:  Normal effort.  Abd:  No distention.  Other:  When patient attempts to sit up she reports significant pain.  Is hard to see where the pain is located at but she reports is in her back along her spine.  When I palpated I do not feel any obvious step-offs she is not able to tell me exactly where the pain is located.  She got no chest wall tenderness, no abdominal tenderness.   ED Results / Procedures / Treatments   Labs (all labs ordered are listed, but only  abnormal results are displayed) Labs Reviewed  CBC WITH DIFFERENTIAL/PLATELET  BASIC METABOLIC PANEL  CK  URINALYSIS, ROUTINE W REFLEX MICROSCOPIC  TROPONIN I (HIGH SENSITIVITY)     EKG  My interpretation of EKG:  Sinus rate of 86 without any ST elevation or T wave inversions, left bundle branch block  RADIOLOGY I have reviewed the CT head personally interpreted no evidence of intracranial hemorrhage   PROCEDURES:  Critical Care performed: No  Procedures   MEDICATIONS ORDERED IN ED: Medications - No data to display   IMPRESSION / MDM / ASSESSMENT AND PLAN / ED COURSE  I reviewed the triage vital signs and the nursing notes.   Patient's presentation is most consistent with acute presentation with potential threat to life or bodily function.   Patient comes in for a fall.  She no chest wall tenderness but x-ray was ordered due to some slight hypoxia.  They were concerned about a potential rib fracture by  suspect this is most likely old given she was nontender on examination and they do report multiple falls.  CT imaging of her head and spine showed a compression fracture of T12 which is where she was initially tender.  . Severe compression fracture of T12, increased in severity  compared to the radiographs from 2019, but appearing chronic.    Discussed with patient pain still 8/10.  Able to ambulate slowly but pain associated with it. no numbness.  Discussed with patient's POA and patient does live by herself.  They have no one that can be around the home today to help her.  They stated that they were concerned about her living at home by herself anyways and they were working on potentially getting her into a memory care unit or facility.  They do not feel comfortable with patient being discharged and will discuss with hospital team for admission due to new fall, compression fracture, back pain.   I did send a message to neurosurgery for them to evaluate her given she will  be inpatient.    FINAL CLINICAL IMPRESSION(S) / ED DIAGNOSES   Final diagnoses:  Compression fracture of body of thoracic vertebra (HCC)  Fall, initial encounter     Rx / DC Orders   ED Discharge Orders     None        Note:  This document was prepared using Dragon voice recognition software and may include unintentional dictation errors.   Concha Se, MD 04/17/23 1257

## 2023-04-17 NOTE — ED Triage Notes (Signed)
Pt arrives via [facility] from home for fall. EMS found pt on her couch when they arrived. Pt could not recall how she got to the couch. Per EMS, pt not on blood thinners. Pt was able to stand on scene to get to stretcher. Pt uses walker for ambulation. Pt c/o upper lumbar and lower thoracic pain. Pt lives alone but has caregiver come to manage her medications.

## 2023-04-17 NOTE — ED Notes (Signed)
Patient assisted off bedpan. Sheets and gown changed as they were wet.

## 2023-04-17 NOTE — ED Notes (Signed)
Assisted pt to toilet. Pt ambulating with walker with unsteady gait. Bed linens changed while patient on commode. Pt assisted back to bed, given 2 warm blankets and placed in position of comfort.

## 2023-04-17 NOTE — ED Notes (Signed)
Pt ambulatory with rolling walker. Pt with slow, steady gait.

## 2023-04-17 NOTE — Assessment & Plan Note (Signed)
Creatinine 0.8 with GFR greater than 60 Monitor

## 2023-04-17 NOTE — Assessment & Plan Note (Addendum)
Systolic blood pressures 190s to 200s Titrate home regimen

## 2023-04-17 NOTE — Assessment & Plan Note (Signed)
PPI ?

## 2023-04-17 NOTE — ED Notes (Signed)
Transporting pt to room 132. Current RN for pt states floor notified pt is otw.

## 2023-04-17 NOTE — H&P (Addendum)
History and Physical    Patient: Erika Daniel AOZ:[id number] DOB: Feb 14, 1922 DOA: 04/17/2023 DOS: the patient was seen and examined on 04/17/2023 PCP: Marisue Ivan, MD  Patient coming from: Home  Chief Complaint:  Chief Complaint  Patient presents with   Fall   HPI: Erika Daniel is a 89 y.o. female with medical history significant of hypertension, GERD presenting with fall, T12 fracture.  Limited history as patient is overall poor historian.  Per report, patient with noted fall at home.  Patient denies any weakness and dizziness prior to event.  Unclear if head trauma.  Has had worsening mid to low back pain.  No fevers or chills.  No nausea or vomiting.  No reported episodes like this in the past.  Has had worsening pain since this happened.  No reported bowel or bladder incontinence. Presented to the ER afebrile, hemodynamically stable.  Satting well room air.  White count 15.4, hemoglobin 14.1, platelets 569.  Urinalysis not indicative of infection.  Creatinine 0.8.  CK and troponin within normal limits.  CT head within normal notes.  CT T-spine with noted severe compression fracture of T12 unclear chronicity.  Per Dr. Alfred Levins, case discussed with Dr. Marcell Barlow with neurosurgery. Review of Systems: As mentioned in the history of present illness. All other systems reviewed and are negative. Past Medical History:  Diagnosis Date   AKI (acute kidney injury) (HCC) 07/17/2021   Basal cell carcinoma 12/31/2016   Left ear sup. helix. Nodular pattern.   Basal cell carcinoma 12/31/2017   Left lateral forehead above brow. Micronodular pattern.    Basal cell carcinoma 04/11/2020   recurrent bcc? Left lateral forehead above brow, excision   Basal cell carcinoma 10/18/2020   L upper ear helix, EDC   Bone loss    Hypertension    Hypokalemia 07/17/2021   Osteoarthrosis    Past Surgical History:  Procedure Laterality Date   EYE SURGERY Right    implant   TONSILLECTOMY     Social History:   reports that she has never smoked. She has never used smokeless tobacco. She reports that she does not drink alcohol and does not use drugs.  Allergies  Allergen Reactions   Bisphosphonates     Other reaction(s): Other (See Comments), Other (See Comments) Intolerance Intolerance    Codeine Other (See Comments)    Nervous feeling    Family History  Problem Relation Age of Onset   Heart disease Mother    Prostate cancer Father     Prior to Admission medications   Medication Sig Start Date End Date Taking? Authorizing Provider  acetaminophen (TYLENOL) 500 MG tablet Take 500 mg by mouth every 6 (six) hours as needed for mild pain or headache.    [provider]  benazepril-hydrochlorthiazide (LOTENSIN HCT) 20-12.5 MG tablet Take 1 tablet by mouth daily.    [provider]  Biotin 5000 MCG TABS Take 5,000 mcg by mouth daily.    [provider]  furosemide (LASIX) 20 MG tablet Take 1 tablet (20 mg total) by mouth daily as needed (leg swelling). 07/20/21   Pennie Banter, DO  Multiple Vitamin (MULTIVITAMIN WITH MINERALS) TABS tablet Take 1 tablet by mouth daily.    [provider]  pantoprazole (PROTONIX) 40 MG tablet Take 40 mg by mouth daily. 09/06/18   [provider]  potassium chloride (KLOR-CON) 20 MEQ packet Take by mouth 2 (two) times daily.    [provider]  verapamil (CALAN-SR) 240 MG  CR tablet Take 240 mg by mouth daily. 10/25/18   [provider]    Physical Exam: Vitals:   04/17/23 1015 04/17/23 1019 04/17/23 1130  BP:  (!) 214/88 (!) 194/97  Pulse:  72 82  Resp:  18 18  Temp:  97.8 F (36.6 C)   TempSrc:  Oral   SpO2:  93%   Weight: 59.1 kg    Height: 5\' 3"  (1.6 m)     Physical Exam Constitutional:      Appearance: She is normal weight.  HENT:     Head: Normocephalic and atraumatic.     Nose: Nose normal.     Mouth/Throat:     Mouth: Mucous membranes are dry.  Eyes:     Pupils: Pupils are  equal, round, and reactive to light.  Cardiovascular:     Rate and Rhythm: Normal rate and regular rhythm.  Pulmonary:     Effort: Pulmonary effort is normal.  Abdominal:     General: Bowel sounds are normal.  Musculoskeletal:        General: Normal range of motion.     Comments: Mild mid to low back tenderness   Skin:    General: Skin is warm.  Neurological:     General: No focal deficit present.  Psychiatric:        Mood and Affect: Mood normal.     Data Reviewed:  There are no new results to review at this time. CT Thoracic Spine Wo Contrast CLINICAL DATA:  Larey Seat at home.  Back pain.  EXAM: CT THORACIC AND LUMBAR SPINE WITHOUT CONTRAST  TECHNIQUE: Multidetector CT imaging of the thoracic and lumbar spine was performed without contrast. Multiplanar CT image reconstructions were also generated.  RADIATION DOSE REDUCTION: This exam was performed according to the departmental dose-optimization program which includes automated exposure control, adjustment of the mA and/or kV according to patient size and/or use of iterative reconstruction technique.  COMPARISON:  Thoracic spine radiographs, 09/22/2017.  FINDINGS: CT THORACIC SPINE FINDINGS  Alignment: Normal.  Vertebrae: Severe compression fracture of T12, increased in severity from the radiographs from 09/22/2017. No other fractures. No bone lesions.  Paraspinal and other soft tissues: No paraspinal mass, edema or hematoma. No acute findings in the visualized lungs. Aortic atherosclerosis and 3 vessel coronary artery calcifications.  Disc levels: Disc spaces are relatively well preserved. There are small endplate osteophytes from the mid through the lower thoracic spine. No convincing disc herniation. No significant disc bulging. Central spinal canal is well preserved.  CT LUMBAR SPINE FINDINGS  Segmentation: 5 lumbar type vertebrae.  Alignment: Mild dextroscoliosis, apex at L1-L2.  No spondylolisthesis.  Vertebrae: No acute fracture or focal pathologic process.  Paraspinal and other soft tissues: Aortic and branch vessel atherosclerotic calcifications. 2.8 cm right adrenal adenoma, Hounsfield units of 1. Fluid density mass consistent with an exophytic cyst along the anterior margin of the left kidney. No paraspinal mass, hematoma or inflammation.  Disc levels: T12-L1: Unremarkable.  L1-L2: Unremarkable.  L2-L3: Minor disc bulging. No disc herniation. No significant stenosis.  L3-L4: Mild disc bulging and ligamentum flavum enlargement. Mild neural foraminal narrowing.  L4-L5: Mild diffuse disc bulging and ligamentum flavum enlargement. Mild narrowing of the central spinal canal and superolateral recesses. Mild right neural foraminal narrowing.  L5-S1: Unremarkable.  IMPRESSION: CT THORACIC SPINE  1. Severe compression fracture of T12, increased in severity compared to the radiographs from 2019, but appearing chronic. 2. No other fractures.  No convincing acute finding.  CT  LUMBAR SPINE  1. No fracture or acute finding. 2. Degenerative changes as detailed.  Electronically Signed   By: Amie Portland M.D.   On: 04/17/2023 11:33 CT Lumbar Spine Wo Contrast CLINICAL DATA:  Larey Seat at home.  Back pain.  EXAM: CT THORACIC AND LUMBAR SPINE WITHOUT CONTRAST  TECHNIQUE: Multidetector CT imaging of the thoracic and lumbar spine was performed without contrast. Multiplanar CT image reconstructions were also generated.  RADIATION DOSE REDUCTION: This exam was performed according to the departmental dose-optimization program which includes automated exposure control, adjustment of the mA and/or kV according to patient size and/or use of iterative reconstruction technique.  COMPARISON:  Thoracic spine radiographs, 09/22/2017.  FINDINGS: CT THORACIC SPINE FINDINGS  Alignment: Normal.  Vertebrae: Severe compression fracture of T12, increased in  severity from the radiographs from 09/22/2017. No other fractures. No bone lesions.  Paraspinal and other soft tissues: No paraspinal mass, edema or hematoma. No acute findings in the visualized lungs. Aortic atherosclerosis and 3 vessel coronary artery calcifications.  Disc levels: Disc spaces are relatively well preserved. There are small endplate osteophytes from the mid through the lower thoracic spine. No convincing disc herniation. No significant disc bulging. Central spinal canal is well preserved.  CT LUMBAR SPINE FINDINGS  Segmentation: 5 lumbar type vertebrae.  Alignment: Mild dextroscoliosis, apex at L1-L2. No spondylolisthesis.  Vertebrae: No acute fracture or focal pathologic process.  Paraspinal and other soft tissues: Aortic and branch vessel atherosclerotic calcifications. 2.8 cm right adrenal adenoma, Hounsfield units of 1. Fluid density mass consistent with an exophytic cyst along the anterior margin of the left kidney. No paraspinal mass, hematoma or inflammation.  Disc levels: T12-L1: Unremarkable.  L1-L2: Unremarkable.  L2-L3: Minor disc bulging. No disc herniation. No significant stenosis.  L3-L4: Mild disc bulging and ligamentum flavum enlargement. Mild neural foraminal narrowing.  L4-L5: Mild diffuse disc bulging and ligamentum flavum enlargement. Mild narrowing of the central spinal canal and superolateral recesses. Mild right neural foraminal narrowing.  L5-S1: Unremarkable.  IMPRESSION: CT THORACIC SPINE  1. Severe compression fracture of T12, increased in severity compared to the radiographs from 2019, but appearing chronic. 2. No other fractures.  No convincing acute finding.  CT LUMBAR SPINE  1. No fracture or acute finding. 2. Degenerative changes as detailed.  Electronically Signed   By: Amie Portland M.D.   On: 04/17/2023 11:33 DG Chest 2 View CLINICAL DATA:  Fall.  EXAM: CHEST - 2 VIEW  COMPARISON:  Chest radiograph  dated January 07, 2015. Thoracic spine radiograph dated Sep 22, 2017.  FINDINGS: The heart size and mediastinal contours are within normal limits. Chronic appearing basilar predominant interstitial changes. No focal consolidation. No pleural effusion or pneumothorax. Age-indeterminate severe compression deformity of the T12 vertebral body, progressed since the prior exam dated Sep 22, 2017. Subtle cortical irregularity of the left lateral sixth rib.  IMPRESSION: 1. Age-indeterminate severe compression deformity of the T12 vertebral body, progressed since the prior exam dated Sep 22, 2017. Recommend correlation with history and physical exam. 2. Subtle cortical irregularity of the left lateral sixth rib is concerning for possible fracture. Further evaluation with CT chest could be obtained. 3. No acute cardiopulmonary findings.  Electronically Signed   By: Hart Robinsons M.D.   On: 04/17/2023 11:23 CT Cervical Spine Wo Contrast CLINICAL DATA:  Larey Seat at home.  EXAM: CT HEAD WITHOUT CONTRAST  CT CERVICAL SPINE WITHOUT CONTRAST  TECHNIQUE: Multidetector CT imaging of the head and cervical spine was performed following the standard  protocol without intravenous contrast. Multiplanar CT image reconstructions of the cervical spine were also generated.  RADIATION DOSE REDUCTION: This exam was performed according to the departmental dose-optimization program which includes automated exposure control, adjustment of the mA and/or kV according to patient size and/or use of iterative reconstruction technique.  COMPARISON:  11/09/2018.  FINDINGS: CT HEAD FINDINGS  Brain: No evidence of acute infarction, hemorrhage, hydrocephalus, extra-axial collection or mass lesion/mass effect.  There is ventricular and sulcal enlargement consistent with age appropriate volume loss. Patchy areas of white matter hypoattenuation are noted consistent with moderate chronic microvascular ischemic  change. This is stable.  Vascular: No hyperdense vessel or unexpected calcification.  Skull: Normal. Negative for fracture or focal lesion.  Sinuses/Orbits: Globes and orbits are unremarkable. Sinuses are clear.  Other: None.  CT CERVICAL SPINE FINDINGS  Alignment: Normal.  Skull base and vertebrae: No acute fracture. No primary bone lesion or focal pathologic process.  Soft tissues and spinal canal: No prevertebral fluid or swelling. No visible canal hematoma.  Disc levels: Disc spaces well maintained. No convincing disc herniation. Mild disc bulging at C6-C7. No significant stenosis. Facet degenerative changes noted on the left most prominently at C3-C4.  Upper chest: No acute findings.  Other: None.  IMPRESSION: CT HEAD:  1. No acute intracranial abnormalities. 2. Chronic microvascular ischemic change of the white matter.  CT CERVICAL SPINE:  No fracture or acute finding.  Electronically Signed   By: Amie Portland M.D.   On: 04/17/2023 11:21 CT HEAD WO CONTRAST ( ) CLINICAL DATA:  Larey Seat at home.  EXAM: CT HEAD WITHOUT CONTRAST  CT CERVICAL SPINE WITHOUT CONTRAST  TECHNIQUE: Multidetector CT imaging of the head and cervical spine was performed following the standard protocol without intravenous contrast. Multiplanar CT image reconstructions of the cervical spine were also generated.  RADIATION DOSE REDUCTION: This exam was performed according to the departmental dose-optimization program which includes automated exposure control, adjustment of the mA and/or kV according to patient size and/or use of iterative reconstruction technique.  COMPARISON:  11/09/2018.  FINDINGS: CT HEAD FINDINGS  Brain: No evidence of acute infarction, hemorrhage, hydrocephalus, extra-axial collection or mass lesion/mass effect.  There is ventricular and sulcal enlargement consistent with age appropriate volume loss. Patchy areas of white matter hypoattenuation are  noted consistent with moderate chronic microvascular ischemic change. This is stable.  Vascular: No hyperdense vessel or unexpected calcification.  Skull: Normal. Negative for fracture or focal lesion.  Sinuses/Orbits: Globes and orbits are unremarkable. Sinuses are clear.  Other: None.  CT CERVICAL SPINE FINDINGS  Alignment: Normal.  Skull base and vertebrae: No acute fracture. No primary bone lesion or focal pathologic process.  Soft tissues and spinal canal: No prevertebral fluid or swelling. No visible canal hematoma.  Disc levels: Disc spaces well maintained. No convincing disc herniation. Mild disc bulging at C6-C7. No significant stenosis. Facet degenerative changes noted on the left most prominently at C3-C4.  Upper chest: No acute findings.  Other: None.  IMPRESSION: CT HEAD:  1. No acute intracranial abnormalities. 2. Chronic microvascular ischemic change of the white matter.  CT CERVICAL SPINE:  No fracture or acute finding.  Electronically Signed   By: Amie Portland M.D.   On: 04/17/2023 11:21  Lab Results  Component Value Date   WBC 15.4 (H) 04/17/2023   HGB 14.1 04/17/2023   HCT 42.1 04/17/2023   MCV 91.9 04/17/2023   PLT 569 (H) 04/17/2023   Last metabolic panel Lab Results  Component Value Date  GLUCOSE 109 (H) 04/17/2023   NA 135 04/17/2023   K 4.0 04/17/2023   CL 100 04/17/2023   CO2 28 04/17/2023   BUN 14 04/17/2023   CREATININE 0.79 04/17/2023   GFRNONAA >60 04/17/2023   CALCIUM 9.4 04/17/2023   PROT 6.8 07/17/2021   ALBUMIN 3.9 07/17/2021   BILITOT 1.1 07/17/2021   ALKPHOS 57 07/17/2021   AST 44 (H) 07/17/2021   ALT 22 07/17/2021   ANIONGAP 7 04/17/2023    Assessment and Plan: * Fall at home, initial encounter Positive fall at home concern for mechanical etiology Patient denies any weakness dizziness prior to event Noted recurrent back pain with T12 compression fracture on imaging Pain control PT OT evaluation Dr.  Myer Haff with neurosurgery consulted-likely not surgical candidate Anticipate placement versus rehab Fall precautions  Closed T12 fracture (HCC) Noted close T12 fracture on imaging with recent fall Unclear chronicity on imaging Dr. Myer Haff with neurosurgery consulted Will otherwise plan for pain control, PT OT evaluation and likely rehab Likely not a surgical candidate Monitor  Benign essential HTN Systolic blood pressures 190s to 200s Titrate home regimen  CKD (chronic kidney disease) stage 3, GFR 30-59 ml/min (HCC) Creatinine 0.8 with GFR greater than 60 Monitor  GERD without esophagitis PPI   Greater than 50% was spent in counseling and coordination of care with patient Total encounter time 80 minutes or more    Advance Care Planning:   Code Status: Full Code   Consults: Dr. Myer Haff w/ NSG  Family Communication: No family at the bedside   Severity of Illness: The appropriate patient status for this patient is INPATIENT. Inpatient status is judged to be reasonable and necessary in order to provide the required intensity of service to ensure the patient's safety. The patient's presenting symptoms, physical exam findings, and initial radiographic and laboratory data in the context of their chronic comorbidities is felt to place them at high risk for further clinical deterioration. Furthermore, it is not anticipated that the patient will be medically stable for discharge from the hospital within 2 midnights of admission.   * I certify that at the point of admission it is my clinical judgment that the patient will require inpatient hospital care spanning beyond 2 midnights from the point of admission due to high intensity of service, high risk for further deterioration and high frequency of surveillance required.*  Author: Floydene Flock, MD 04/17/2023 12:54 PM  For on call review [email or web address].

## 2023-04-17 NOTE — ED Notes (Signed)
Patient transported to MRI 

## 2023-04-17 NOTE — ED Notes (Signed)
Patient transported to CT 

## 2023-04-17 NOTE — Evaluation (Signed)
Occupational Therapy Evaluation Patient Details Name: Erika Daniel MRN: [id number] DOB: 1922-04-21 Today's Date: 04/17/2023   History of Present Illness Erika Daniel is a 89 y.o. female with medical history significant of hypertension, GERD presenting with fall, T12 fracture unclear chronicity.   Clinical Impression   Erika Daniel was seen for OT evaluation this date. Prior to hospital admission, pt was MOD I using RW. Pt lives alone. Pt currently requires CGA + RW for toilet t/f and pericare. MOD A don B socks seated EOB. Session limited by persistent hypertension - seated BP 176/113, MAP 126, HR 112 bpm. Pt would benefit from skilled OT to address noted impairments and functional limitations (see below for any additional details). Upon hospital discharge, recommend OT follow up, if unable to have assistance at home recommend STR.     If plan is discharge home, recommend the following: Help with stairs or ramp for entrance    Functional Status Assessment  Patient has had a recent decline in their functional status and demonstrates the ability to make significant improvements in function in a reasonable and predictable amount of time.  Equipment Recommendations  BSC/3in1    Recommendations for Other Services       Precautions / Restrictions Precautions Precautions: Fall Restrictions Weight Bearing Restrictions: No      Mobility Bed Mobility Overal bed mobility: Needs Assistance Bed Mobility: Supine to Sit, Sit to Supine     Supine to sit: Supervision Sit to supine: Supervision        Transfers Overall transfer level: Needs assistance Equipment used: Rolling walker (2 wheels) Transfers: Sit to/from Stand Sit to Stand: Supervision                  Balance Overall balance assessment: Needs assistance Sitting-balance support: No upper extremity supported, Feet supported Sitting balance-Leahy Scale: Good     Standing balance support: Bilateral upper extremity  supported Standing balance-Leahy Scale: Fair                             ADL either performed or assessed with clinical judgement   ADL Overall ADL's : Needs assistance/impaired                                       General ADL Comments: CGA + RW for toilet t/f and pericare. MOD A don B socks seated EOB.      Pertinent Vitals/Pain Pain Assessment Pain Assessment: No/denies pain     Extremity/Trunk Assessment Upper Extremity Assessment Upper Extremity Assessment: Overall WFL for tasks assessed   Lower Extremity Assessment Lower Extremity Assessment: Generalized weakness       Communication Communication Communication: No apparent difficulties   Cognition Arousal: Alert Behavior During Therapy: WFL for tasks assessed/performed Overall Cognitive Status: Within Functional Limits for tasks assessed                                                  Home Living Family/patient expects to be discharged to:: Private residence Living Arrangements: Alone Available Help at Discharge: Family;Available PRN/intermittently Type of Home: House Home Access: Stairs to enter [employer] of Steps: 2 Entrance Stairs-Rails: Can reach both Home Layout: One level  Home Equipment: Agricultural consultant (2 wheels)          Prior Functioning/Environment Prior Level of Function : Independent/Modified Independent             Mobility Comments: MOD I using RW ADLs Comments: assist for meals and medication, reports a neice comes by daily for a few hours        OT Problem List: Decreased strength;Decreased range of motion;Decreased activity tolerance;Impaired balance (sitting and/or standing)      OT Treatment/Interventions: Self-care/ADL training;Therapeutic exercise;Energy conservation;DME and/or AE instruction;Therapeutic activities    OT Goals(Current goals can be found in the care plan section) Acute Rehab  OT Goals Patient Stated Goal: to go home OT Goal Formulation: With patient Time For Goal Achievement: 05/01/23 Potential to Achieve Goals: Good ADL Goals Pt Will Perform Grooming: with modified independence;standing Pt Will Perform Lower Body Dressing: with modified independence;sit to/from stand Pt Will Transfer to Toilet: with modified independence;ambulating;regular height toilet  OT Frequency: Min 1X/week    Co-evaluation              AM-PAC OT "6 Clicks" Daily Activity     Outcome Measure Help from another person eating meals?: None Help from another person taking care of personal grooming?: A Little Help from another person toileting, which includes using toliet, bedpan, or urinal?: A Little Help from another person bathing (including washing, rinsing, drying)?: A Little Help from another person to put on and taking off regular upper body clothing?: None Help from another person to put on and taking off regular lower body clothing?: A Lot 6 Click Score: 19   End of Session Equipment Utilized During Treatment: Rolling walker (2 wheels) Nurse Communication: Mobility status  Activity Tolerance: Patient tolerated treatment well Patient left: in bed;with call bell/phone within reach  OT Visit Diagnosis: Other abnormalities of gait and mobility (R26.89);Muscle weakness (generalized) (M62.81)                Time: 1530-1550 OT Time Calculation (min): 20 min Charges:  OT General Charges $OT Visit: 1 Visit OT Evaluation $OT Eval Moderate Complexity: 1 Mod  Kathie Dike, M.S. OTR/L  04/17/23, 4:19 PM  ascom [phone]

## 2023-04-18 DIAGNOSIS — S22000A Wedge compression fracture of unspecified thoracic vertebra, initial encounter for closed fracture: Principal | ICD-10-CM

## 2023-04-18 DIAGNOSIS — W19XXXA Unspecified fall, initial encounter: Secondary | ICD-10-CM | POA: Diagnosis not present

## 2023-04-18 DIAGNOSIS — S22070A Wedge compression fracture of T9-T10 vertebra, initial encounter for closed fracture: Secondary | ICD-10-CM | POA: Diagnosis not present

## 2023-04-18 DIAGNOSIS — R296 Repeated falls: Secondary | ICD-10-CM | POA: Diagnosis not present

## 2023-04-18 DIAGNOSIS — Y92009 Unspecified place in unspecified non-institutional (private) residence as the place of occurrence of the external cause: Secondary | ICD-10-CM | POA: Diagnosis not present

## 2023-04-18 LAB — COMPREHENSIVE METABOLIC PANEL
ALT: 19 U/L (ref 0–44)
AST: 24 U/L (ref 15–41)
Albumin: 3.6 g/dL (ref 3.5–5.0)
Alkaline Phosphatase: 51 U/L (ref 38–126)
Anion gap: 10 (ref 5–15)
BUN: 19 mg/dL (ref 8–23)
CO2: 27 mmol/L (ref 22–32)
Calcium: 9 mg/dL (ref 8.9–10.3)
Chloride: 96 mmol/L — ABNORMAL LOW (ref 98–111)
Creatinine, Ser: 0.95 mg/dL (ref 0.44–1.00)
GFR, Estimated: 54 mL/min — ABNORMAL LOW (ref >60)
Glucose, Bld: 114 mg/dL — ABNORMAL HIGH (ref 70–99)
Potassium: 3.5 mmol/L (ref 3.5–5.1)
Sodium: 133 mmol/L — ABNORMAL LOW (ref 135–145)
Total Bilirubin: 1.1 mg/dL (ref <1.2)
Total Protein: 5.9 g/dL — ABNORMAL LOW (ref 6.5–8.1)

## 2023-04-18 LAB — CBC
HCT: 37.9 % (ref 36.0–46.0)
Hemoglobin: 12.9 g/dL (ref 12.0–15.0)
MCH: 30.6 pg (ref 26.0–34.0)
MCHC: 34 g/dL (ref 30.0–36.0)
MCV: 90 fL (ref 80.0–100.0)
Platelets: 540 10*3/uL — ABNORMAL HIGH (ref 150–400)
RBC: 4.21 MIL/uL (ref 3.87–5.11)
RDW: 13.2 % (ref 11.5–15.5)
WBC: 11.4 10*3/uL — ABNORMAL HIGH (ref 4.0–10.5)
nRBC: 0 % (ref 0.0–0.2)

## 2023-04-18 MED ORDER — ENOXAPARIN SODIUM 30 MG/0.3ML IJ SOSY
30.0000 mg | PREFILLED_SYRINGE | INTRAMUSCULAR | Status: DC
  Administered 2023-04-18 – 2023-04-21 (×4): 30 mg via SUBCUTANEOUS
  Filled 2023-04-18 (×4): qty 0.3

## 2023-04-18 MED ORDER — ACETAMINOPHEN 325 MG PO TABS
650.0000 mg | ORAL_TABLET | Freq: Four times a day (QID) | ORAL | Status: DC | PRN
  Administered 2023-04-18 – 2023-04-22 (×6): 650 mg via ORAL
  Filled 2023-04-18 (×6): qty 2

## 2023-04-18 MED ORDER — PANTOPRAZOLE SODIUM 40 MG PO TBEC
40.0000 mg | DELAYED_RELEASE_TABLET | Freq: Every day | ORAL | Status: DC
  Administered 2023-04-18 – 2023-04-22 (×5): 40 mg via ORAL
  Filled 2023-04-18 (×5): qty 1

## 2023-04-18 MED ORDER — OXYCODONE HCL 5 MG PO TABS
5.0000 mg | ORAL_TABLET | ORAL | Status: DC | PRN
  Administered 2023-04-18 – 2023-04-22 (×8): 5 mg via ORAL
  Filled 2023-04-18 (×9): qty 1

## 2023-04-18 MED ORDER — CALCITONIN (SALMON) 200 UNIT/ACT NA SOLN
1.0000 | Freq: Every day | NASAL | Status: DC
  Administered 2023-04-18 – 2023-04-22 (×5): 1 via NASAL
  Filled 2023-04-18 (×2): qty 3.7

## 2023-04-18 MED ORDER — LIDOCAINE 5 % EX PTCH
1.0000 | MEDICATED_PATCH | CUTANEOUS | Status: DC
  Administered 2023-04-18 – 2023-04-22 (×5): 1 via TRANSDERMAL
  Filled 2023-04-18 (×5): qty 1

## 2023-04-18 MED ORDER — OXYBUTYNIN CHLORIDE ER 5 MG PO TB24
5.0000 mg | ORAL_TABLET | Freq: Every day | ORAL | Status: DC
  Administered 2023-04-18 – 2023-04-21 (×4): 5 mg via ORAL
  Filled 2023-04-18 (×4): qty 1

## 2023-04-18 MED ORDER — ORAL CARE MOUTH RINSE: 15.0000 mL | OROMUCOSAL | Status: DC | PRN

## 2023-04-18 MED ORDER — VERAPAMIL HCL ER 240 MG PO TBCR
240.0000 mg | EXTENDED_RELEASE_TABLET | Freq: Every day | ORAL | Status: DC
  Administered 2023-04-18 – 2023-04-22 (×5): 240 mg via ORAL
  Filled 2023-04-18 (×6): qty 1

## 2023-04-18 MED ORDER — LOSARTAN POTASSIUM 50 MG PO TABS
50.0000 mg | ORAL_TABLET | Freq: Every day | ORAL | Status: DC
  Administered 2023-04-18 – 2023-04-22 (×5): 50 mg via ORAL
  Filled 2023-04-18 (×5): qty 1

## 2023-04-18 NOTE — Hospital Course (Addendum)
89yo with h/o HTN and GERD who presented on 12/4 with a fall, resulting in a T10 complete burst fracture (also has an old anterior wedge compression fracture of T12 with near complete loss of height anteriorly and centrally).  Neurosurgery is consulting, unlikely to be a surgical candidate.  Plan for pain control, PT/OT consults, and likely rehab placement.

## 2023-04-18 NOTE — TOC Progression Note (Signed)
Transition of Care [facility]) - Progression Note    Patient Details  Name: KALLISSA HONTS MRN: [id number] Date of Birth: 1922-01-05  Transition of Care [facility]) CM/SW Contact  [name], RN Phone Number: 04/18/2023, 3:50 PM  Clinical Narrative:     The patient lives alone and uses a RW at home, her niece comes daily, Anticipate that she will need to go to [place], PASSR number [id number] A Waiting on Brace, PT will evaluate after she gets the Brace  Expected Discharge Plan: Skilled Nursing Facility Barriers to Discharge: SNF Pending bed offer  Expected Discharge Plan and Services   Discharge Planning Services: CM Consult   Living arrangements for the past 2 months: Single Family Home                 DME Arranged: N/A DME Agency: NA                   Social Determinants of Health (SDOH) Interventions SDOH Screenings   Food Insecurity: No Food Insecurity (03/04/2023)   Received from [facility]  Transportation Needs: No Transportation Needs (03/04/2023)   Received from [facility]  Utilities: Not At Risk (03/04/2023)   Received from [facility]  Financial Resource Strain: Low Risk  (03/04/2023)   Received from [facility]  Tobacco Use: Low Risk  (03/04/2023)   Received from [facility]    Readmission Risk Interventions     No data to display

## 2023-04-18 NOTE — Progress Notes (Signed)
Unable to complete screening assessment. Pt is not oriented at this time. Will notify incoming shift. Will continue to monitor.

## 2023-04-18 NOTE — Progress Notes (Signed)
Progress Note   Patient: Erika Daniel ZOX:[id number] DOB: 12/31/1921 DOA: 04/17/2023     1 DOS: the patient was seen and examined on 04/18/2023   Brief hospital course: 89yo with h/o HTN and GERD who presented on 12/4 with a fall, resulting in a T10 complete burst fracture (also has an old anterior wedge compression fracture of T12 with near complete loss of height anteriorly and centrally).  Neurosurgery is consulting, unlikely to be a surgical candidate.  Plan for pain control, PT/OT consults, and likely rehab placement.  Assessment and Plan:  Fall at home, initial encounter Mechanical fall at home Patient lives alone Patient denies any weakness/dizziness prior to event Noted recurrent back pain with acute T10 burst fracture and chronic T12 compression fracture on imaging Pain control PT OT evaluation Discussed with Dr. Myer Haff from neurosurgery - likely not surgical candidate, recommend TLSO Fall precautions Will otherwise plan for pain control, PT OT evaluation and likely rehab Will add lidoderm patch, calcitonin nasal spray, oxy, and morphine as needed for pain She is likely to remain hospitalized until early next week as we seek adequate pain control and consider PT/OT evaluations for possible placement If pain is not controlled, consider IR consult for kyphoplasty  Benign essential HTN Systolic blood pressures 190s to 200s Resume home losartan, verapamil   Stage 3a CKD Appears to be stable at this time Attempt to avoid nephrotoxic medications Recheck BMP in AM    GERD without esophagitis Continue pantoprazole  Agitation Patient is 89 and lives independently She is having some agitation and nursing is requesting a sitter - will try telesitter first and then move to bedside sitter if needed Possible MCI at baseline Delirium precautions     Consultants: Neurosurgery PT OT TOC team  Procedures: None  Antibiotics: None  30 Day Unplanned Readmission Risk  Score    Flowsheet Row ED to Hosp-Admission (Current) from 04/17/2023 in [facility] (1A)  30 Day Unplanned Readmission Risk Score (%) 9.28 Filed at 04/18/2023 0401       This score is the patient's risk of an unplanned readmission within 30 days of being discharged (0 -100%). The score is based on dignosis, age, lab data, medications, orders, and past utilization.   Low:  0-14.9   Medium: 15-21.9   High: 22-29.9   Extreme: 30 and above           Subjective: Feeling ok.  No pain at rest today.  Wants to try to get up.   Objective: Vitals:   04/17/23 2134 04/17/23 2145  BP:  (!) 168/86  Pulse:  96  Resp:  13  Temp: 98.2 F (36.8 C) 98.1 F (36.7 C)  SpO2:  96%    Intake/Output Summary (Last 24 hours) at 04/18/2023 0805 Last data filed at 04/18/2023 8119 Gross per 24 hour  Intake --  Output 450 ml  Net -450 ml   Filed Weights   04/17/23 1015 04/17/23 2300  Weight: 59.1 kg 57.5 kg    Exam:  General:  Appears calm and comfortable and is in NAD Eyes:  EOMI, normal lids, iris ENT:  grossly normal hearing, lips & tongue, mmm Neck:  no LAD, masses or thyromegaly Cardiovascular:  RRR, no m/r/g. No LE edema.  Respiratory:   CTA bilaterally with no wheezes/rales/rhonchi.  Normal respiratory effort. Abdomen:  soft, NT, ND Skin:  no rash or induration seen on limited exam Musculoskeletal:  grossly normal tone BUE/BLE, good ROM, no bony abnormality Psychiatric:  grossly normal mood and affect, speech fluent and appropriate Neurologic:  CN 2-12 grossly intact, moves all extremities in coordinated fashion  Data Reviewed: I have reviewed the patient's lab results since admission.  Pertinent labs for today include:   Na++ 133 Glucose 114 BUN 19/Creatinine 0.95/GFR 54 WBC 11.4 Platelets 540 Unremarkable UA     Family Communication: Niece was present throughout evaluation  Disposition: Status is: Inpatient Remains inpatient  appropriate because: ongoing evaluation and management     Time spent: 50 minutes  Unresulted Labs (From admission, onward)    None        Author: Jonah Blue, MD 04/18/2023 8:05 AM  For on call review [email or web address].

## 2023-04-18 NOTE — Progress Notes (Signed)
PT Cancellation Note  Patient Details Name: Erika Daniel MRN: [id number] DOB: [birth year]   Cancelled Treatment:    Reason Eval/Treat Not Completed: Other (comment).  PT consult received.  Chart reviewed.  Per nursing, pt does not have brace yet.  Will re-attempt PT evaluation at a later date/time once brace arrives.  Hendricks Limes, PT 04/18/23, 8:54 AM

## 2023-04-18 NOTE — Progress Notes (Signed)
Pt impulsive getting up repeatedly throughout the shift to void small amounts of urine. Bladder scan = 0mL. D/w MD during rounds.

## 2023-04-18 NOTE — Evaluation (Signed)
Physical Therapy Evaluation Patient Details Name: Erika Daniel MRN: [id number] DOB: July 08, 1921 Today's Date: 04/18/2023  History of Present Illness  Pt is a 89 y.o. female presenting to hospital 04/17/23 with c/o fall.  Pt admitted with fall at home.  Imaging showing acute fx T10 vertebral body; severe compression fx T12; possible fx L lateral 6th rib.  PMH includes htn, T12 fx, AKI, basal cell carcinoma.  Clinical Impression  Prior to recent medical concerns, pt was modified independent ambulating with RW; lives alone in 1 level home with steps to enter; has some family support.  Pt denying any pain during session.  Currently pt is CGA to min assist with transfers and ambulating up to 40 feet with RW use (limited distance d/t pt fatigue).  Pt with a few loss of balance during session (mostly when turning) requiring assist to regain balance.  Pt would currently benefit from skilled PT to address noted impairments and functional limitations (see below for any additional details).  Upon hospital discharge, pt would benefit from ongoing therapy.     If plan is discharge home, recommend the following: A little help with walking and/or transfers;A little help with bathing/dressing/bathroom;Assistance with cooking/housework;Assist for transportation;Help with stairs or ramp for entrance   Can travel by private vehicle        Equipment Recommendations Rolling walker (2 wheels);BSC/3in1  Recommendations for Other Services       Functional Status Assessment Patient has had a recent decline in their functional status and demonstrates the ability to make significant improvements in function in a reasonable and predictable amount of time.     Precautions / Restrictions Precautions Precautions: Fall Precaution Comments: TLSO on when OOB Required Braces or Orthoses: Spinal Brace Spinal Brace: Thoracolumbosacral orthotic Restrictions Weight Bearing Restrictions: No      Mobility  Bed Mobility                General bed mobility comments: Deferred (pt in recliner beginning/end of session)    Transfers Overall transfer level: Needs assistance Equipment used: Rolling walker (2 wheels) Transfers: Sit to/from Stand, Bed to chair/wheelchair/BSC Sit to Stand: Contact guard assist   Step pivot transfers: Min assist       General transfer comment: x1 trial from [facility] and x1 trial from recliner standing (vc's for UE placement); stand step turn BSC to recliner min assist (d/t pt with loss of balance requiring assist to regain balance)    Ambulation/Gait Ambulation/Gait assistance: Contact guard assist, Min assist Gait Distance (Feet): 40 Feet Assistive device: Rolling walker (2 wheels) Gait Pattern/deviations: Step-through pattern, Decreased step length - right, Decreased step length - left Gait velocity: decreased     General Gait Details: intermittent assist to steady; assist for balance when turning with RW  Stairs            Wheelchair Mobility     Tilt Bed    Modified Rankin (Stroke Patients Only)       Balance Overall balance assessment: Needs assistance Sitting-balance support: No upper extremity supported, Feet supported Sitting balance-Leahy Scale: Good Sitting balance - Comments: steady reaching within BOS   Standing balance support: Bilateral upper extremity supported, Reliant on assistive device for balance, During functional activity Standing balance-Leahy Scale: Poor Standing balance comment: intermittent assist with standing activities                             Pertinent Vitals/Pain Pain Assessment Pain Assessment:  No/denies pain SpO2 on room air stable and WFL throughout treatment session.  Pt's HR 91 to 108 bpm during sessions activities.    Home Living Family/patient expects to be discharged to:: Private residence Living Arrangements: Alone Available Help at Discharge: Family;Available PRN/intermittently Type of Home:  House Home Access: Stairs to enter Entrance Stairs-Rails: Left Entrance Stairs-Number of Steps: 2   Home Layout: One level Home Equipment: Rolling Walker (2 wheels);Grab bars - tub/shower;Grab bars - toilet      Prior Function Prior Level of Function : Independent/Modified Independent             Mobility Comments: Modified independent ambulating with RW. ADLs Comments: Assist for meals and medication; pt reports a neice comes by daily for a few hours     Extremity/Trunk Assessment   Upper Extremity Assessment Upper Extremity Assessment: Overall WFL for tasks assessed    Lower Extremity Assessment Lower Extremity Assessment: Generalized weakness       Communication   Communication Communication: No apparent difficulties Cueing Techniques: Verbal cues  Cognition Arousal: Alert Behavior During Therapy: WFL for tasks assessed/performed Overall Cognitive Status: Within Functional Limits for tasks assessed                                          General Comments  Nursing cleared pt for participation in physical therapy.  Pt agreeable to PT session.    Exercises     Assessment/Plan    PT Assessment Patient needs continued PT services  PT Problem List Decreased strength;Decreased activity tolerance;Decreased balance;Decreased mobility;Decreased knowledge of use of DME;Decreased knowledge of precautions       PT Treatment Interventions DME instruction;Gait training;Functional mobility training;Therapeutic activities;Therapeutic exercise;Balance training;Stair training;Patient/family education    PT Goals (Current goals can be found in the Care Plan section)  Acute Rehab PT Goals Patient Stated Goal: to improve strength PT Goal Formulation: With patient Time For Goal Achievement: 05/02/23 Potential to Achieve Goals: Good    Frequency Min 1X/week     Co-evaluation               AM-PAC PT "6 Clicks" Mobility  Outcome Measure Help  needed turning from your back to your side while in a flat bed without using bedrails?: A Little Help needed moving from lying on your back to sitting on the side of a flat bed without using bedrails?: A Little Help needed moving to and from a bed to a chair (including a wheelchair)?: A Little Help needed standing up from a chair using your arms (e.g., wheelchair or bedside chair)?: A Little Help needed to walk in hospital room?: A Little Help needed climbing 3-5 steps with a railing? : A Lot 6 Click Score: 17    End of Session Equipment Utilized During Treatment: Gait belt;Back brace Activity Tolerance: Patient limited by fatigue Patient left: in chair;with call bell/phone within reach;with chair alarm set;with nursing/sitter in room Nurse Communication: Mobility status;Precautions PT Visit Diagnosis: Unsteadiness on feet (R26.81);Other abnormalities of gait and mobility (R26.89);Muscle weakness (generalized) (M62.81);History of falling (Z91.81)    Time: 4401-0272 PT Time Calculation (min) (ACUTE ONLY): 21 min   Charges:   PT Evaluation $PT Eval Low Complexity: 1 Low PT Treatments $Therapeutic Activity: 8-22 mins PT General Charges $$ ACUTE PT VISIT: 1 Visit        Hendricks Limes, PT 04/18/23, 5:40 PM

## 2023-04-18 NOTE — Consult Note (Signed)
Consult requested by:  Dr. Alvester Morin  Consult requested for:  T10 fracture  Primary Physician:  Marisue Ivan, MD  History of Present Illness: 04/18/2023 Erika Daniel is here today with a chief complaint of falls.  She has back pain.  She denies any additional symptoms.    She is unable to give a full history.  Review of Systems:  unobtainable  Past Medical History: Past Medical History:  Diagnosis Date   AKI (acute kidney injury) (HCC) 07/17/2021   Basal cell carcinoma 12/31/2016   Left ear sup. helix. Nodular pattern.   Basal cell carcinoma 12/31/2017   Left lateral forehead above brow. Micronodular pattern.    Basal cell carcinoma 04/11/2020   recurrent bcc? Left lateral forehead above brow, excision   Basal cell carcinoma 10/18/2020   L upper ear helix, EDC   Bone loss    Hypertension    Hypokalemia 07/17/2021   Osteoarthrosis     Past Surgical History: Past Surgical History:  Procedure Laterality Date   EYE SURGERY Right    implant   TONSILLECTOMY      Allergies: Allergies as of 04/17/2023 - Review Complete 04/17/2023  Allergen Reaction Noted   Bisphosphonates  08/01/2013   Codeine Other (See Comments) 06/17/2013    Medications: Current Meds  Medication Sig   acetaminophen (TYLENOL) 500 MG tablet Take 500 mg by mouth every 6 (six) hours as needed for mild pain or headache.   Biotin 5000 MCG TABS Take 5,000 mcg by mouth daily.   furosemide (LASIX) 20 MG tablet Take 1 tablet (20 mg total) by mouth daily as needed (leg swelling).   losartan (COZAAR) 50 MG tablet Take 1 tablet by mouth daily.   Multiple Vitamin (MULTIVITAMIN WITH MINERALS) TABS tablet Take 1 tablet by mouth daily.   oxybutynin (DITROPAN-XL) 5 MG 24 hr tablet Take 5 mg by mouth at bedtime.   pantoprazole (PROTONIX) 40 MG tablet Take 40 mg by mouth daily.   potassium chloride SA (KLOR-CON M) 20 MEQ tablet Take 20 mEq by mouth daily as needed (When taking Furosemide).   verapamil  (CALAN-SR) 240 MG CR tablet Take 240 mg by mouth daily.    Social History: Social History   Tobacco Use   Smoking status: Never   Smokeless tobacco: Never  Substance Use Topics   Alcohol use: No   Drug use: Never    Family Medical History: Family History  Problem Relation Age of Onset   Heart disease Mother    Prostate cancer Father     Physical Examination: Vitals:   04/18/23 0857 04/18/23 1358  BP: (!) 156/66 (!) 161/77  Pulse: 82 91  Resp: 16 17  Temp: 97.7 F (36.5 C) 98.3 F (36.8 C)  SpO2: 93% 97%    General: Patient is in no apparent distress. Attention to examination is appropriate.  Neck:   Supple.  Full range of motion.  Respiratory: Patient is breathing without any difficulty.   NEUROLOGICAL:     Awake, alert, oriented to person and "2018." She says October to choice.  Speech is clear and fluent.  Cranial Nerves: Pupils equal round and reactive to light.  Facial tone is symmetric.  Facial sensation is symmetric. Shoulder shrug is symmetric. Tongue protrusion is midline.    Strength: Side Biceps Triceps Deltoid Interossei Grip Wrist Ext. Wrist Flex.  R 5 5 5 5 5 5 5   L 5 5 5 5 5 5 5    Side Iliopsoas Quads Hamstring PF  DF EHL  R 5 5 5 5 5 5   L 5 5 5 5 5 5     Bilateral upper and lower extremity sensation is intact to light touch.    No evidence of dysmetria noted.  Gait is untested.     Medical Decision Making  Imaging: MRI T spine 04/17/2023 IMPRESSION: 1. Acute fracture of the T10 vertebral body. Utilizing the AO spine classification system, I think this is probably an AO fracture, a complete burst fracture. Mild posterior bowing of the posterior margin of the vertebral body, no more than 2 mm. One could question very minimal edema in the pedicles, but this is not conclusive. I do not think there is visible disruption of the posterior tension band. No evidence of inter spinous ligament injury or ligamentum flavum disruption. Very  small amount of epidural blood in the ventral epidural space without compressive effect upon the cord. 2. Old healed anterior wedge compression fracture of T12. Near complete loss of height anteriorly and centrally. Loss of height of 25% at the posterior aspect of the vertebral body with mild posterior bowing but no ongoing neural compression. Chronic kyphotic deformity at the T12 level.     Electronically Signed   By: Paulina Fusi M.D.   On: 04/17/2023 18:17    I have personally reviewed the images and agree with the above interpretation.  Assessment and Plan: Erika Daniel is a pleasant 89 y.o. female with acute T10 fracture.  She shows signs of dementia and is not a good candidate.    I recommend TLSO when OOB and outpatient follow-up.    I have communicated my recommendations to the requesting physician and coordinated care to facilitate these recommendations.     Alyssandra Hulsebus K. Myer Haff MD, [facility]

## 2023-04-18 NOTE — Plan of Care (Signed)
  Problem: Clinical Measurements: Goal: Will remain free from infection Outcome: Progressing   Problem: Clinical Measurements: Goal: Respiratory complications will improve Outcome: Progressing   Problem: Clinical Measurements: Goal: Cardiovascular complication will be avoided Outcome: Progressing   Problem: Elimination: Goal: Will not experience complications related to urinary retention Outcome: Progressing   Problem: Pain Management: Goal: General experience of comfort will improve Outcome: Progressing   Problem: Safety: Goal: Ability to remain free from injury will improve Outcome: Progressing

## 2023-04-19 DIAGNOSIS — Y92009 Unspecified place in unspecified non-institutional (private) residence as the place of occurrence of the external cause: Secondary | ICD-10-CM | POA: Diagnosis not present

## 2023-04-19 DIAGNOSIS — W19XXXA Unspecified fall, initial encounter: Secondary | ICD-10-CM | POA: Diagnosis not present

## 2023-04-19 DIAGNOSIS — S22000A Wedge compression fracture of unspecified thoracic vertebra, initial encounter for closed fracture: Secondary | ICD-10-CM | POA: Diagnosis not present

## 2023-04-19 NOTE — TOC Progression Note (Signed)
Transition of Care [facility]) - Progression Note    Patient Details  Name: Erika Daniel MRN: [id number] Date of Birth: 04/19/1922  [facility] CM/SW Contact  [name], RN Phone Number: 04/19/2023, 1:59 PM  Clinical Narrative:    Debbora Dus completed to go to [facility] Will review the bed offers once obtained   Expected Discharge Plan: Skilled Nursing Facility Barriers to Discharge: SNF Pending bed offer  Expected Discharge Plan and Services   Discharge Planning Services: CM Consult   Living arrangements for the past 2 months: Single Family Home                 DME Arranged: N/A DME Agency: NA                   Social Determinants of Health (SDOH) Interventions SDOH Screenings   Food Insecurity: No Food Insecurity (04/19/2023)  Housing: Low Risk  (04/19/2023)  Transportation Needs: No Transportation Needs (03/04/2023)   [facility]  Utilities: Not At Risk (04/19/2023)  Financial Resource Strain: Low Risk  (03/04/2023)   Received from [facility]  Tobacco Use: Low Risk  (03/04/2023)   Received from [facility]    Readmission Risk Interventions     No data to display

## 2023-04-19 NOTE — Progress Notes (Signed)
Progress Note   Patient: Erika Daniel OZH:[id number] DOB: Oct 23, 1921 DOA: 04/17/2023     2 DOS: the patient was seen and examined on 04/19/2023   Brief hospital course: 89yo with h/o HTN and GERD who presented on 12/4 with a fall, resulting in a T10 complete burst fracture (also has an old anterior wedge compression fracture of T12 with near complete loss of height anteriorly and centrally).  Neurosurgery is consulting, unlikely to be a surgical candidate.  Plan for pain control, PT/OT consults, and likely rehab placement.  Assessment and Plan:  Fall at home, initial encounter Mechanical fall at home Patient lives alone Patient denies any weakness/dizziness prior to event Noted recurrent back pain with acute T10 burst fracture and chronic T12 compression fracture on imaging Pain control PT/OT evaluation -> SNF rehab Discussed with Dr. Myer Haff from neurosurgery - likely not surgical candidate, recommend TLSO Fall precautions Will add lidoderm patch, calcitonin nasal spray, oxy, and morphine as needed for pain If pain is not controlled, consider IR consult for kyphoplasty   Benign essential HTN Continue home losartan, verapamil   Stage 3a CKD Appears to be stable at this time Attempt to avoid nephrotoxic medications Recheck BMP in AM    GERD without esophagitis Continue pantoprazole   Agitation Patient is 89 and lives independently She is having some agitation and nursing is requesting a sitter - will try telesitter first and then move to bedside sitter if needed MCI at baseline Delirium precautions        Consultants: Neurosurgery PT OT TOC team   Procedures: None   Antibiotics: None   30 Day Unplanned Readmission Risk Score    Flowsheet Row ED to Hosp-Admission (Current) from 04/17/2023 in [facility] (1A)  30 Day Unplanned Readmission Risk Score (%) 10.35 Filed at 04/19/2023 0801       This score is the patient's risk of  an unplanned readmission within 30 days of being discharged (0 -100%). The score is based on dignosis, age, lab data, medications, orders, and past utilization.   Low:  0-14.9   Medium: 15-21.9   High: 22-29.9   Extreme: 30 and above           Subjective: She reports feeling well, slept well.  Her niece is present and reports they have been considering memory care facilities for her, as they are concerned about her ability to be independent.  They plan to consider further based on her progress in SNF rehab.   Objective: Vitals:   04/19/23 0810 04/19/23 1505  BP: (!) 160/84 (!) 115/57  Pulse: 64 83  Resp: 16 16  Temp: 98.2 F (36.8 C) 97.6 F (36.4 C)  SpO2: 97% 98%    Intake/Output Summary (Last 24 hours) at 04/19/2023 1644 Last data filed at 04/19/2023 1058 Gross per 24 hour  Intake 240 ml  Output --  Net 240 ml   Filed Weights   04/17/23 1015 04/17/23 2300  Weight: 59.1 kg 57.5 kg    Exam:  General:  Appears calm and comfortable and is in NAD; sitting up in chair with TLSO brace Eyes:  EOMI, normal lids, iris ENT:  grossly normal hearing, lips & tongue, mmm Neck:  no LAD, masses or thyromegaly Cardiovascular:  RRR, no m/r/g. No LE edema.  Respiratory:   CTA bilaterally with no wheezes/rales/rhonchi.  Normal respiratory effort. Abdomen:  soft, NT, ND Skin:  no rash or induration seen on limited exam Musculoskeletal:  grossly normal tone BUE/BLE,  no bony abnormality Psychiatric:  grossly normal mood and affect, speech fluent and appropriate Neurologic:  CN 2-12 grossly intact, moves all extremities in coordinated fashion  Data Reviewed: I have reviewed the patient's lab results since admission.  Pertinent labs for today include:   None     Family Communication: Niece was present throughout evaluation  Disposition: Status is: Inpatient Remains inpatient appropriate because: awaiting placement     Time spent: 35 minutes  Unresulted Labs (From  admission, onward)     Start     Ordered   04/20/23 0500  Basic metabolic panel  Tomorrow morning,   R        04/19/23 0817   04/20/23 0500  CBC with Differential/Platelet  Tomorrow morning,   R        04/19/23 4540             Author: Jonah Blue, MD 04/19/2023 4:44 PM  For on call review [email or web address].

## 2023-04-19 NOTE — Progress Notes (Signed)
Physical Therapy Treatment Patient Details Name: Erika Daniel MRN: [id number] DOB: 06/10/1921 Today's Date: 04/19/2023   History of Present Illness Pt is a 89 y.o. female presenting to hospital 04/17/23 with c/o fall.  Pt admitted with fall at home.  Imaging showing acute fx T10 vertebral body; severe compression fx T12; possible fx L lateral 6th rib.  PMH includes htn, T12 fx, AKI, basal cell carcinoma.    PT Comments  Pt resting in bed upon PT arrival; agreeable to therapy; pt reporting needing to toilet.  Pt required max assist to donn TLSO beginning of session.   During session pt min to mod assist with bed mobility via logrolling; min assist with transfers using RW; and min assist to ambulate 5 feet bed to [facility] with RW use.  Limited session d/t pt needing extended time on [facility] for toileting.  Will continue to focus on strengthening, balance, and progressive functional mobility during hospitalization.   If plan is discharge home, recommend the following: A little help with walking and/or transfers;A little help with bathing/dressing/bathroom;Assistance with cooking/housework;Assist for transportation;Help with stairs or ramp for entrance   Can travel by private vehicle        Equipment Recommendations  Rolling walker (2 wheels);BSC/3in1    Recommendations for Other Services       Precautions / Restrictions Precautions Precautions: Fall Precaution Comments: TLSO on when OOB Required Braces or Orthoses: Spinal Brace Spinal Brace: Thoracolumbosacral orthotic Restrictions Weight Bearing Restrictions: No     Mobility  Bed Mobility Overal bed mobility: Needs Assistance Bed Mobility: Rolling, Sidelying to Sit Rolling: Min assist Sidelying to sit: Mod assist       General bed mobility comments: assist for trunk; vc's for logrolling technique    Transfers Overall transfer level: Needs assistance Equipment used: Rolling walker (2 wheels) Transfers: Sit to/from Stand Sit to  Stand: Min assist           General transfer comment: min assist to stand from bed and control descent sitting onto BSC; vc's for UE placement    Ambulation/Gait Ambulation/Gait assistance: Min assist Gait Distance (Feet): 5 Feet (bed to recliner) Assistive device: Rolling walker (2 wheels)   Gait velocity: decreased     General Gait Details: assist to steady; assist for balance when turning with RW   Stairs             Wheelchair Mobility     Tilt Bed    Modified Rankin (Stroke Patients Only)       Balance Overall balance assessment: Needs assistance Sitting-balance support: No upper extremity supported, Feet supported Sitting balance-Leahy Scale: Good Sitting balance - Comments: steady reaching within BOS   Standing balance support: Bilateral upper extremity supported, Reliant on assistive device for balance, During functional activity Standing balance-Leahy Scale: Poor Standing balance comment: assist for balance with standing activities                            Cognition Arousal: Alert Behavior During Therapy: WFL for tasks assessed/performed Overall Cognitive Status: Within Functional Limits for tasks assessed                                          Exercises      General Comments  Nursing cleared pt for participation in physical therapy.  Pt agreeable to PT session.  Pertinent Vitals/Pain Pain Assessment Pain Assessment: No/denies pain Vitals (HR and SpO2 on room air) stable and WFL throughout treatment session.    Home Living                          Prior Function            PT Goals (current goals can now be found in the care plan section) Acute Rehab PT Goals Patient Stated Goal: to improve strength PT Goal Formulation: With patient Time For Goal Achievement: 05/02/23 Potential to Achieve Goals: Good Progress towards PT goals: Progressing toward goals    Frequency    Min  1X/week      PT Plan      Co-evaluation              AM-PAC PT "6 Clicks" Mobility   Outcome Measure  Help needed turning from your back to your side while in a flat bed without using bedrails?: A Little Help needed moving from lying on your back to sitting on the side of a flat bed without using bedrails?: A Lot Help needed moving to and from a bed to a chair (including a wheelchair)?: A Little Help needed standing up from a chair using your arms (e.g., wheelchair or bedside chair)?: A Little Help needed to walk in hospital room?: A Little Help needed climbing 3-5 steps with a railing? : A Lot 6 Click Score: 16    End of Session Equipment Utilized During Treatment: Gait belt;Back brace Activity Tolerance: Patient tolerated treatment well Patient left: with call bell/phone within reach (on [facility] with pt's niece present) Nurse Communication: Mobility status;Precautions;Other (comment) (Nurse cleared PT to leave pt on [facility] with niece present (d/t pt needing extended time on toilet)--niece reported she would call for assistance once pt finished toileting) PT Visit Diagnosis: Unsteadiness on feet (R26.81);Other abnormalities of gait and mobility (R26.89);Muscle weakness (generalized) (M62.81);History of falling (Z91.81)     Time: 1610-9604 PT Time Calculation (min) (ACUTE ONLY): 24 min  Charges:    $Therapeutic Activity: 23-37 mins PT General Charges $$ ACUTE PT VISIT: 1 Visit                     Hendricks Limes, PT 04/19/23, 2:47 PM

## 2023-04-19 NOTE — Plan of Care (Signed)

## 2023-04-19 NOTE — NC FL2 (Signed)
[place] MEDICAID FL2 LEVEL OF CARE FORM     IDENTIFICATION  Patient Name: Erika Daniel Birthdate: 12-20-1921 Sex: female Admission Date (Current Location): 04/17/2023  [place] and [place] Number:  Chiropodist and Address:  [facility], [address], [place], [place] [address]      Provider Number: [id number]  Attending Physician Name and Address:  Jonah Blue, MD  Relative Name and Phone Number:  Katherine Basset [phone]    Current Level of Care: Hospital Recommended Level of Care: Skilled Nursing Facility Prior Approval Number:    Date Approved/Denied:   PASRR Number: [id number]  Discharge Plan: SNF    Current Diagnoses: Patient Active Problem List   Diagnosis Date Noted   Compression fracture of body of thoracic vertebra (HCC) 04/18/2023   Fall at home, initial encounter 04/17/2023   Closed T12 fracture (HCC) 04/17/2023   Prediabetes 08/31/2022   Generalized weakness 07/17/2021   Hyponatremia 07/17/2021   Cyst of left kidney 05/12/2021   CKD (chronic kidney disease) stage 3, GFR 30-59 ml/min (HCC) 06/14/2020   Encounter for general adult medical examination without abnormal findings 04/12/2016   Vitamin D insufficiency 03/24/2014   Benign essential HTN 03/22/2014   GERD without esophagitis 03/22/2014   H/O osteoporosis 03/22/2014    Orientation RESPIRATION BLADDER Height & Weight     Self, Time, Situation, Place  Normal Continent Weight: 57.5 kg Height:  5\' 3"  (160 cm)  BEHAVIORAL SYMPTOMS/MOOD NEUROLOGICAL BOWEL NUTRITION STATUS      Continent Diet (see DC summary)  AMBULATORY STATUS COMMUNICATION OF NEEDS Skin   Limited Assist Verbally Normal                       Personal Care Assistance Level of Assistance  Bathing, Feeding, Dressing Bathing Assistance: Limited assistance Feeding assistance: Independent Dressing Assistance: Limited assistance     Functional Limitations Info  Hearing, Speech,  Sight Sight Info: Adequate Hearing Info: Adequate Speech Info: Adequate    SPECIAL CARE FACTORS FREQUENCY  PT (By licensed PT), OT (By licensed OT)     PT Frequency: 5 times per week OT Frequency: 5 times per week            Contractures Contractures Info: Not present    Additional Factors Info  Allergies, Code Status Code Status Info: full code Allergies Info: Bisphosphonates, Codeine           Current Medications (04/19/2023):  This is the current hospital active medication list Current Facility-Administered Medications  Medication Dose Route Frequency Provider Last Rate Last Admin   acetaminophen (TYLENOL) tablet 650 mg  650 mg Oral Q6H PRN Jonah Blue, MD   650 mg at 04/19/23 0929   calcitonin (salmon) (MIACALCIN/FORTICAL) nasal spray 1 spray  1 spray Alternating Nares Daily Jonah Blue, MD   1 spray at 04/19/23 0931   enoxaparin (LOVENOX) injection 30 mg  30 mg Subcutaneous Q24H Dorothea Ogle B, RPH   30 mg at 04/18/23 2030   hydrALAZINE (APRESOLINE) injection 10 mg  10 mg Intravenous Q4H PRN Floydene Flock, MD       lidocaine (LIDODERM) 5 % 1 patch  1 patch Transdermal Q24H Jonah Blue, MD   1 patch at 04/19/23 2725   losartan (COZAAR) tablet 50 mg  50 mg Oral Daily Jonah Blue, MD   50 mg at 04/19/23 0929   morphine (PF) 2 MG/ML injection 2 mg  2 mg Intravenous Q2H PRN Floydene Flock,  MD       ondansetron (ZOFRAN) tablet 4 mg  4 mg Oral Q6H PRN Floydene Flock, MD   4 mg at 04/18/23 1149   Or   ondansetron (ZOFRAN) injection 4 mg  4 mg Intravenous Q6H PRN Floydene Flock, MD   4 mg at 04/17/23 1928   Oral care mouth rinse  15 mL Mouth Rinse PRN Jonah Blue, MD       oxybutynin (DITROPAN-XL) 24 hr tablet 5 mg  5 mg Oral QHS Jonah Blue, MD   5 mg at 04/18/23 2029   oxyCODONE (Oxy IR/ROXICODONE) immediate release tablet 5 mg  5 mg Oral Q4H PRN Jonah Blue, MD   5 mg at 04/18/23 2029   pantoprazole (PROTONIX) EC tablet 40 mg  40 mg Oral  Daily Jonah Blue, MD   40 mg at 04/19/23 0929   verapamil (CALAN-SR) CR tablet 240 mg  240 mg Oral Daily Jonah Blue, MD   240 mg at 04/19/23 4098     Discharge Medications: Please see discharge summary for a list of discharge medications.  Relevant Imaging Results:  Relevant Lab Results:   Additional Information SS# [id number]  [name], RN

## 2023-04-20 DIAGNOSIS — Y92009 Unspecified place in unspecified non-institutional (private) residence as the place of occurrence of the external cause: Secondary | ICD-10-CM | POA: Diagnosis not present

## 2023-04-20 DIAGNOSIS — W19XXXA Unspecified fall, initial encounter: Secondary | ICD-10-CM | POA: Diagnosis not present

## 2023-04-20 DIAGNOSIS — S22000A Wedge compression fracture of unspecified thoracic vertebra, initial encounter for closed fracture: Secondary | ICD-10-CM | POA: Diagnosis not present

## 2023-04-20 LAB — CBC WITH DIFFERENTIAL/PLATELET
Abs Immature Granulocytes: 0.07 10*3/uL (ref 0.00–0.07)
Basophils Absolute: 0 10*3/uL (ref 0.0–0.1)
Basophils Relative: 0 %
Eosinophils Absolute: 0 10*3/uL (ref 0.0–0.5)
Eosinophils Relative: 0 %
HCT: 40.2 % (ref 36.0–46.0)
Hemoglobin: 13.7 g/dL (ref 12.0–15.0)
Immature Granulocytes: 1 %
Lymphocytes Relative: 5 %
Lymphs Abs: 0.7 10*3/uL (ref 0.7–4.0)
MCH: 30.9 pg (ref 26.0–34.0)
MCHC: 34.1 g/dL (ref 30.0–36.0)
MCV: 90.5 fL (ref 80.0–100.0)
Monocytes Absolute: 1.8 10*3/uL — ABNORMAL HIGH (ref 0.1–1.0)
Monocytes Relative: 13 %
Neutro Abs: 11.6 10*3/uL — ABNORMAL HIGH (ref 1.7–7.7)
Neutrophils Relative %: 81 %
Platelets: 609 10*3/uL — ABNORMAL HIGH (ref 150–400)
RBC: 4.44 MIL/uL (ref 3.87–5.11)
RDW: 13.5 % (ref 11.5–15.5)
WBC: 14.2 10*3/uL — ABNORMAL HIGH (ref 4.0–10.5)
nRBC: 0 % (ref 0.0–0.2)

## 2023-04-20 LAB — BASIC METABOLIC PANEL
Anion gap: 7 (ref 5–15)
BUN: 31 mg/dL — ABNORMAL HIGH (ref 8–23)
CO2: 29 mmol/L (ref 22–32)
Calcium: 9 mg/dL (ref 8.9–10.3)
Chloride: 97 mmol/L — ABNORMAL LOW (ref 98–111)
Creatinine, Ser: 0.95 mg/dL (ref 0.44–1.00)
GFR, Estimated: 54 mL/min — ABNORMAL LOW (ref >60)
Glucose, Bld: 144 mg/dL — ABNORMAL HIGH (ref 70–99)
Potassium: 3.3 mmol/L — ABNORMAL LOW (ref 3.5–5.1)
Sodium: 133 mmol/L — ABNORMAL LOW (ref 135–145)

## 2023-04-20 NOTE — Progress Notes (Signed)
Progress Note   Patient: Erika Daniel ZOX:[id number] DOB: Sep 18, 1921 DOA: 04/17/2023     3 DOS: the patient was seen and examined on 04/20/2023   Brief hospital course: 89yo with h/o HTN and GERD who presented on 12/4 with a fall, resulting in a T10 complete burst fracture (also has an old anterior wedge compression fracture of T12 with near complete loss of height anteriorly and centrally).  Neurosurgery is consulting, unlikely to be a surgical candidate.  Plan for pain control, PT/OT consults, and likely rehab placement.  Assessment and Plan:  Fall at home, initial encounter Mechanical fall at home Patient lives alone Noted recurrent back pain with acute T10 burst fracture and chronic T12 compression fracture on imaging Pain control PT/OT evaluation -> SNF rehab Discussed with Dr. Myer Haff from neurosurgery - likely not surgical candidate, recommend TLSO Fall precautions Will add lidoderm patch, calcitonin nasal spray, oxy, and morphine as needed for pain If pain is not controlled, consider IR consult for kyphoplasty   Benign essential HTN Continue home losartan, verapamil   Stage 3a CKD Appears to be stable at this time Attempt to avoid nephrotoxic medications Recheck BMP in AM    GERD without esophagitis Continue pantoprazole   Agitation Patient is 89 and lives independently She has had some agitation MCI at baseline Delirium precautions        Consultants: Neurosurgery PT OT [facility] team   Procedures: None   Antibiotics: None    30 Day Unplanned Readmission Risk Score    Flowsheet Row ED to Hosp-Admission (Current) from 04/17/2023 in [facility] (1A)  30 Day Unplanned Readmission Risk Score (%) 11.94 Filed at 04/20/2023 0800       This score is the patient's risk of an unplanned readmission within 30 days of being discharged (0 -100%). The score is based on dignosis, age, lab data, medications, orders, and past  utilization.   Low:  0-14.9   Medium: 15-21.9   High: 22-29.9   Extreme: 30 and above           Subjective: She did not feel well today.  No specific issues, just felt bad overall.   Objective: Vitals:   04/20/23 0843 04/20/23 1507  BP: (!) 146/80   Pulse:    Resp: 16 14  Temp: 98.3 F (36.8 C)   SpO2: 97%     Intake/Output Summary (Last 24 hours) at 04/20/2023 1508 Last data filed at 04/19/2023 1900 Gross per 24 hour  Intake 0 ml  Output --  Net 0 ml   Filed Weights   04/17/23 1015 04/17/23 2300  Weight: 59.1 kg 57.5 kg    Exam:  General:  Appears calm and comfortable and is in NAD; sitting up in chair with TLSO brace, looks more fatigued today Eyes:  EOMI, normal lids, iris ENT:  grossly normal hearing, lips & tongue, mmm Neck:  no LAD, masses or thyromegaly Cardiovascular:  RRR, no m/r/g. No LE edema.  Respiratory:   CTA bilaterally with no wheezes/rales/rhonchi.  Normal respiratory effort. Abdomen:  soft, NT, ND Skin:  no rash or induration seen on limited exam Musculoskeletal:  grossly normal tone BUE/BLE, no bony abnormality Psychiatric:  blunted mood and affect, speech fluent and appropriate Neurologic:  CN 2-12 grossly intact, moves all extremities in coordinated fashion  Data Reviewed: I have reviewed the patient's lab results since admission.  Pertinent labs for today include:   Na++ 133 K+ 3.3 Glucose 144 BUN 31/Creatinine 0/95/GFR 54 -  stable WBC 14.2 Platelets 609     Family Communication: None present today  Disposition: Status is: Inpatient Remains inpatient appropriate because: awaiting placement     Time spent: 35 minutes  Unresulted Labs (From admission, onward)     Start     Ordered   04/21/23 0500  CBC with Differential/Platelet  Tomorrow morning,   R        04/20/23 1506   04/21/23 0500  Basic metabolic panel  Tomorrow morning,   R        04/20/23 1506             Author: Jonah Blue, MD 04/20/2023 3:08  PM  For on call review [email or web address].

## 2023-04-20 NOTE — Progress Notes (Signed)
Physical Therapy Treatment Patient Details Name: Erika Daniel MRN: [id number] DOB: 05/22/1921 Today's Date: 04/20/2023   History of Present Illness Pt is a 89 y.o. female presenting to hospital 04/17/23 with c/o fall.  Pt admitted with fall at home.  Imaging showing acute fx T10 vertebral body; severe compression fx T12; possible fx L lateral 6th rib.  PMH includes htn, T12 fx, AKI, basal cell carcinoma.    PT Comments  Pt is making progress with activity tolerance and assist level for tranfers, functional standing balance, and gait with RW (40') and TLSO donned at an overall CGA level.  Continued balance, strengthening, and activity tolerance training will assist pt towards greater safety and functional independence with mobility.   If plan is discharge home, recommend the following: A little help with walking and/or transfers;A little help with bathing/dressing/bathroom;Assistance with cooking/housework;Assist for transportation;Help with stairs or ramp for entrance   Can travel by private vehicle        Equipment Recommendations  Rolling walker (2 wheels);BSC/3in1    Recommendations for Other Services       Precautions / Restrictions Precautions Precautions: Fall Precaution Comments: TLSO on when OOB Required Braces or Orthoses: Spinal Brace Spinal Brace: Thoracolumbosacral orthotic Restrictions Weight Bearing Restrictions: No     Mobility  Bed Mobility               General bed mobility comments: NT pt up in recliner.    Transfers Overall transfer level: Needs assistance Equipment used: Rolling walker (2 wheels) Transfers: Sit to/from Stand, Bed to chair/wheelchair/BSC Sit to Stand: Contact guard assist   Step pivot transfers: Contact guard assist       General transfer comment: vc's for UE placement    Ambulation/Gait Ambulation/Gait assistance: Contact guard assist Gait Distance (Feet): 40 Feet Assistive device: Rolling walker (2 wheels) Gait  Pattern/deviations: Step-through pattern, Decreased step length - right, Decreased step length - left, Trunk flexed Gait velocity: decreased     General Gait Details: steady and slow with walker.   Stairs             Wheelchair Mobility     Tilt Bed    Modified Rankin (Stroke Patients Only)       Balance Overall balance assessment: Needs assistance Sitting-balance support: No upper extremity supported, Feet supported Sitting balance-Leahy Scale: Good Sitting balance - Comments: steady reaching within BOS   Standing balance support: Reliant on assistive device for balance, During functional activity, Single extremity supported Standing balance-Leahy Scale: Fair Standing balance comment: CGAt for balance with standing activities WBOS                            Cognition Arousal: Alert Behavior During Therapy: WFL for tasks assessed/performed Overall Cognitive Status: Within Functional Limits for tasks assessed                                          Exercises      General Comments        Pertinent Vitals/Pain Pain Assessment Pain Assessment: Faces Faces Pain Scale: Hurts a little bit Pain Location: mid back Pain Descriptors / Indicators: Grimacing, Guarding Pain Intervention(s): Premedicated before session, Patient requesting pain meds-RN notified, Monitored during session, Limited activity within patient's tolerance    Home Living  Prior Function            PT Goals (current goals can now be found in the care plan section) Acute Rehab PT Goals Patient Stated Goal: to improve strength PT Goal Formulation: With patient Time For Goal Achievement: 05/02/23 Potential to Achieve Goals: Good Progress towards PT goals: Progressing toward goals    Frequency    Min 1X/week      PT Plan      Co-evaluation              AM-PAC PT "6 Clicks" Mobility   Outcome Measure  Help  needed turning from your back to your side while in a flat bed without using bedrails?: A Little Help needed moving from lying on your back to sitting on the side of a flat bed without using bedrails?: A Lot Help needed moving to and from a bed to a chair (including a wheelchair)?: A Little Help needed standing up from a chair using your arms (e.g., wheelchair or bedside chair)?: A Little Help needed to walk in hospital room?: A Little Help needed climbing 3-5 steps with a railing? : A Lot 6 Click Score: 16    End of Session Equipment Utilized During Treatment: Gait belt;Back brace Activity Tolerance: Patient tolerated treatment well Patient left: with call bell/phone within reach;with chair alarm set;in chair Nurse Communication: Mobility status;Precautions PT Visit Diagnosis: Unsteadiness on feet (R26.81);Other abnormalities of gait and mobility (R26.89);Muscle weakness (generalized) (M62.81);History of falling (Z91.81)     Time: 1010-1037 PT Time Calculation (min) (ACUTE ONLY): 27 min  Charges:    $Therapeutic Activity: 23-37 mins PT General Charges $$ ACUTE PT VISIT: 1 Visit                     Hortencia Conradi, PTA  04/20/23, 10:48 AM

## 2023-04-20 NOTE — Plan of Care (Signed)

## 2023-04-21 ENCOUNTER — Encounter: Payer: Self-pay | Admitting: Family Medicine

## 2023-04-21 DIAGNOSIS — S22000A Wedge compression fracture of unspecified thoracic vertebra, initial encounter for closed fracture: Secondary | ICD-10-CM | POA: Diagnosis not present

## 2023-04-21 DIAGNOSIS — W19XXXA Unspecified fall, initial encounter: Secondary | ICD-10-CM | POA: Diagnosis not present

## 2023-04-21 DIAGNOSIS — Y92009 Unspecified place in unspecified non-institutional (private) residence as the place of occurrence of the external cause: Secondary | ICD-10-CM | POA: Diagnosis not present

## 2023-04-21 LAB — CBC WITH DIFFERENTIAL/PLATELET
Abs Immature Granulocytes: 0.06 10*3/uL (ref 0.00–0.07)
Basophils Absolute: 0 10*3/uL (ref 0.0–0.1)
Basophils Relative: 0 %
Eosinophils Absolute: 0 10*3/uL (ref 0.0–0.5)
Eosinophils Relative: 0 %
HCT: 37 % (ref 36.0–46.0)
Hemoglobin: 12.5 g/dL (ref 12.0–15.0)
Immature Granulocytes: 1 %
Lymphocytes Relative: 9 %
Lymphs Abs: 1 10*3/uL (ref 0.7–4.0)
MCH: 30.9 pg (ref 26.0–34.0)
MCHC: 33.8 g/dL (ref 30.0–36.0)
MCV: 91.4 fL (ref 80.0–100.0)
Monocytes Absolute: 1.5 10*3/uL — ABNORMAL HIGH (ref 0.1–1.0)
Monocytes Relative: 14 %
Neutro Abs: 7.7 10*3/uL (ref 1.7–7.7)
Neutrophils Relative %: 76 %
Platelets: 531 10*3/uL — ABNORMAL HIGH (ref 150–400)
RBC: 4.05 MIL/uL (ref 3.87–5.11)
RDW: 13.8 % (ref 11.5–15.5)
WBC: 10.2 10*3/uL (ref 4.0–10.5)
nRBC: 0 % (ref 0.0–0.2)

## 2023-04-21 LAB — BASIC METABOLIC PANEL
Anion gap: 11 (ref 5–15)
BUN: 39 mg/dL — ABNORMAL HIGH (ref 8–23)
CO2: 26 mmol/L (ref 22–32)
Calcium: 8.7 mg/dL — ABNORMAL LOW (ref 8.9–10.3)
Chloride: 94 mmol/L — ABNORMAL LOW (ref 98–111)
Creatinine, Ser: 1.15 mg/dL — ABNORMAL HIGH (ref 0.44–1.00)
GFR, Estimated: 43 mL/min — ABNORMAL LOW (ref >60)
Glucose, Bld: 107 mg/dL — ABNORMAL HIGH (ref 70–99)
Potassium: 3.4 mmol/L — ABNORMAL LOW (ref 3.5–5.1)
Sodium: 131 mmol/L — ABNORMAL LOW (ref 135–145)

## 2023-04-21 MED ORDER — POLYETHYLENE GLYCOL 3350 17 G PO PACK
17.0000 g | PACK | Freq: Every day | ORAL | Status: DC
  Administered 2023-04-21 – 2023-04-22 (×2): 17 g via ORAL
  Filled 2023-04-21 (×2): qty 1

## 2023-04-21 MED ORDER — DOCUSATE SODIUM 100 MG PO CAPS
100.0000 mg | ORAL_CAPSULE | Freq: Two times a day (BID) | ORAL | Status: DC
  Administered 2023-04-21 – 2023-04-22 (×3): 100 mg via ORAL
  Filled 2023-04-21 (×3): qty 1

## 2023-04-21 MED ORDER — BISACODYL 5 MG PO TBEC
5.0000 mg | DELAYED_RELEASE_TABLET | Freq: Every day | ORAL | Status: DC | PRN
  Administered 2023-04-22: 5 mg via ORAL
  Filled 2023-04-21: qty 1

## 2023-04-21 NOTE — Plan of Care (Signed)

## 2023-04-21 NOTE — Progress Notes (Signed)
Physical Therapy Treatment Patient Details Name: Erika Daniel MRN: [id number] DOB: [birth year] Today's Date: 04/21/2023   History of Present Illness Pt is a 89 y.o. female presenting to hospital 04/17/23 with c/o fall.  Pt admitted with fall at home.  Imaging showing acute fx T10 vertebral body; severe compression fx T12; possible fx L lateral 6th rib.  PMH includes htn, T12 fx, AKI, basal cell carcinoma.    PT Comments  Attempted earlier this am.  Pt reports increased pain today and confirmed by RN in room stating she had more discomfort last night.  She has been regularly medicated for pain.  Returned later in AM per pt request and while she does agree to try she is greatly limited by pain.  Attempted to transition to sitting but about 1/2 way up she yells out in pain and stiffens/guards and is assisted back to supine.  She does do supine AAROM for BLE and is generally comfortable with ex in bed.  Further mobility deferred at this time due to discomfort.   If plan is discharge home, recommend the following: Assistance with cooking/housework;Assist for transportation;Help with stairs or ramp for entrance;A lot of help with walking and/or transfers;A lot of help with bathing/dressing/bathroom   Can travel by private vehicle        Equipment Recommendations  Rolling walker (2 wheels);BSC/3in1    Recommendations for Other Services       Precautions / Restrictions Precautions Precautions: Fall Precaution Comments: TLSO on when OOB Required Braces or Orthoses: Spinal Brace Spinal Brace: Thoracolumbosacral orthotic Restrictions Weight Bearing Restrictions: No     Mobility  Bed Mobility Overal bed mobility: Needs Assistance Bed Mobility: Rolling, Sidelying to Sit Rolling: Mod assist Sidelying to sit: Max assist       General bed mobility comments: unable to get fully to sitting due to pain Patient Response: Cooperative  Transfers                   General transfer  comment: deferred    Ambulation/Gait                   Stairs             Wheelchair Mobility     Tilt Bed Tilt Bed Patient Response: Cooperative  Modified Rankin (Stroke Patients Only)       Balance                                            Cognition Arousal: Alert Behavior During Therapy: WFL for tasks assessed/performed Overall Cognitive Status: Within Functional Limits for tasks assessed                                          Exercises      General Comments        Pertinent Vitals/Pain Pain Assessment Pain Assessment: Faces Faces Pain Scale: Hurts worst Pain Location: yells out with attempt to transition to sitting despite pre-medicated Pain Descriptors / Indicators: Grimacing, Guarding, Crying Pain Intervention(s): Premedicated before session, Repositioned, Limited activity within patient's tolerance, Monitored during session    Home Living  Prior Function            PT Goals (current goals can now be found in the care plan section) Progress towards PT goals: Not progressing toward goals - comment    Frequency    Min 1X/week      PT Plan      Co-evaluation              AM-PAC PT "6 Clicks" Mobility   Outcome Measure  Help needed turning from your back to your side while in a flat bed without using bedrails?: A Lot Help needed moving from lying on your back to sitting on the side of a flat bed without using bedrails?: Total Help needed moving to and from a bed to a chair (including a wheelchair)?: A Lot Help needed standing up from a chair using your arms (e.g., wheelchair or bedside chair)?: A Lot Help needed to walk in hospital room?: A Lot Help needed climbing 3-5 steps with a railing? : Total 6 Click Score: 10    End of Session   Activity Tolerance: Patient limited by pain Patient left: in bed;with call bell/phone within reach;with bed  alarm set Nurse Communication: Mobility status;Precautions PT Visit Diagnosis: Unsteadiness on feet (R26.81);Other abnormalities of gait and mobility (R26.89);Muscle weakness (generalized) (M62.81);History of falling (Z91.81)     Time: 6578-4696 PT Time Calculation (min) (ACUTE ONLY): 9 min  Charges:    $Therapeutic Activity: 8-22 mins PT General Charges $$ ACUTE PT VISIT: 1 Visit                   Danielle Dess, PTA 04/21/23, 12:38 PM

## 2023-04-21 NOTE — TOC Progression Note (Signed)
Transition of Care [facility]) - Progression Note    Patient Details  Name: CLAIRESE SNEATHEN MRN: [id number] Date of Birth: 11/08/1921  [facility]) CM/SW Contact  Susa Simmonds, [place] Phone Number: 04/21/2023, 1:59 PM  Clinical Narrative:   CSW contacted patients niece Neoma Laming, [phone]. CSW presented bed offers ([facility], [facility], [facility], [facility]). Patients niece stated that family would prefer [facility] but if [facility]'t have a bed they would like [facility]. CSW resent SNF referral to [facility]. CSW spoke with Sue Lush in admissions who stated she will most likely have a bed tomorrow but would check in the morning and confirm.     Expected Discharge Plan: Skilled Nursing Facility Barriers to Discharge: SNF Pending bed offer  Expected Discharge Plan and Services   Discharge Planning Services: CM Consult   Living arrangements for the past 2 months: Single Family Home                 DME Arranged: N/A DME Agency: NA                   Social Determinants of Health (SDOH) Interventions SDOH Screenings   Food Insecurity: No Food Insecurity (04/19/2023)  Housing: Low Risk  (04/19/2023)  Transportation Needs: No Transportation Needs (03/04/2023)   Received from [facility]  Utilities: Not At Risk (04/19/2023)  Financial Resource Strain: Low Risk  (03/04/2023)   Received from [facility]  Tobacco Use: Low Risk  (03/04/2023)   Received from [facility]    Readmission Risk Interventions     No data to display

## 2023-04-21 NOTE — Progress Notes (Signed)
Progress Note   Patient: Erika Daniel ZOX:[id number] DOB: Dec 12, 1921 DOA: 04/17/2023     4 DOS: the patient was seen and examined on 04/21/2023   Brief hospital course: 89yo with h/o HTN and GERD who presented on 12/4 with a fall, resulting in a T10 complete burst fracture (also has an old anterior wedge compression fracture of T12 with near complete loss of height anteriorly and centrally).  Neurosurgery is consulting, unlikely to be a surgical candidate.  Plan for pain control, PT/OT consults, and likely rehab placement.  Assessment and Plan:  Fall at home, initial encounter Mechanical fall at home Patient lives alone Noted recurrent back pain with acute T10 burst fracture and chronic T12 compression fracture on imaging Pain control PT/OT evaluation -> SNF rehab Discussed with Dr. Myer Haff from neurosurgery - likely not surgical candidate, recommend TLSO Fall precautions Will add lidoderm patch, calcitonin nasal spray, oxy, and morphine as needed for pain   Benign essential HTN Continue home losartan, verapamil  Electrolyte abnormalities Mild hyponatremia, will follow Mild hypokalemia, will replete Recheck BMP in AM   Stage 3a CKD Appears to be stable at this time Attempt to avoid nephrotoxic medications Recheck BMP in AM    GERD without esophagitis Continue pantoprazole   Agitation Patient is 89 and lives independently She has had some agitation MCI at baseline Delirium precautions   Constipation Will add bowel regimen       Consultants: Neurosurgery PT OT [facility] team   Procedures: None   Antibiotics: None    30 Day Unplanned Readmission Risk Score    Flowsheet Row ED to Hosp-Admission (Current) from 04/17/2023 in [facility] (1A)  30 Day Unplanned Readmission Risk Score (%) 12.09 Filed at 04/21/2023 0401       This score is the patient's risk of an unplanned readmission within 30 days of being discharged (0 -100%).  The score is based on dignosis, age, lab data, medications, orders, and past utilization.   Low:  0-14.9   Medium: 15-21.9   High: 22-29.9   Extreme: 30 and above           Subjective: Seen while in bed this AM.  Appears fatigued but she does not complain of feeling bad today.   Objective: Vitals:   04/21/23 0001 04/21/23 0814  BP: (!) 124/53 (!) 128/54  Pulse: 89 81  Resp: 18 14  Temp: 97.8 F (36.6 C) 98.4 F (36.9 C)  SpO2: 91% 95%    Intake/Output Summary (Last 24 hours) at 04/21/2023 1346 Last data filed at 04/20/2023 1900 Gross per 24 hour  Intake 440 ml  Output --  Net 440 ml   Filed Weights   04/17/23 1015 04/17/23 2300  Weight: 59.1 kg 57.5 kg    Exam:  General:  Appears calm and comfortable and is in NAD; looks more fatigued today Eyes:  EOMI, normal lids, iris ENT:  grossly normal hearing, lips & tongue, mmm Neck:  no LAD, masses or thyromegaly Cardiovascular:  RRR, no m/r/g. No LE edema.  Respiratory:   CTA bilaterally with no wheezes/rales/rhonchi.  Normal respiratory effort. Abdomen:  soft, NT, ND Skin:  no rash or induration seen on limited exam Musculoskeletal:  grossly normal tone BUE/BLE, no bony abnormality Psychiatric:  blunted mood and affect, speech fluent and appropriate Neurologic:  CN 2-12 grossly intact, moves all extremities in coordinated fashion  Data Reviewed: I have reviewed the patient's lab results since admission.  Pertinent labs for today include:  Na++ 131 K+ 3.4 Glucose 108 BUN 39/Creatinine 1.15/GFR 43 WBC 10.2 Platelets 531     Family Communication: None present  Disposition: Status is: Inpatient Remains inpatient appropriate because: unsafe disposition     Time spent: 35 minutes  Unresulted Labs (From admission, onward)     Start     Ordered   04/22/23 0500  CBC with Differential/Platelet  Tomorrow morning,   R        04/21/23 0758   04/22/23 0500  Basic metabolic panel  Tomorrow morning,   R         04/21/23 0758             Author: Jonah Blue, MD 04/21/2023 1:46 PM  For on call review [email or web address].

## 2023-04-22 DIAGNOSIS — Y92009 Unspecified place in unspecified non-institutional (private) residence as the place of occurrence of the external cause: Secondary | ICD-10-CM | POA: Diagnosis not present

## 2023-04-22 DIAGNOSIS — W19XXXA Unspecified fall, initial encounter: Secondary | ICD-10-CM | POA: Diagnosis not present

## 2023-04-22 DIAGNOSIS — I10 Essential (primary) hypertension: Secondary | ICD-10-CM | POA: Diagnosis not present

## 2023-04-22 LAB — CBC WITH DIFFERENTIAL/PLATELET
Abs Immature Granulocytes: 0.05 10*3/uL (ref 0.00–0.07)
Basophils Absolute: 0 10*3/uL (ref 0.0–0.1)
Basophils Relative: 0 %
Eosinophils Absolute: 0.1 10*3/uL (ref 0.0–0.5)
Eosinophils Relative: 1 %
HCT: 38.1 % (ref 36.0–46.0)
Hemoglobin: 12.8 g/dL (ref 12.0–15.0)
Immature Granulocytes: 1 %
Lymphocytes Relative: 9 %
Lymphs Abs: 0.9 10*3/uL (ref 0.7–4.0)
MCH: 30.8 pg (ref 26.0–34.0)
MCHC: 33.6 g/dL (ref 30.0–36.0)
MCV: 91.8 fL (ref 80.0–100.0)
Monocytes Absolute: 1.3 10*3/uL — ABNORMAL HIGH (ref 0.1–1.0)
Monocytes Relative: 14 %
Neutro Abs: 6.9 10*3/uL (ref 1.7–7.7)
Neutrophils Relative %: 75 %
Platelets: 530 10*3/uL — ABNORMAL HIGH (ref 150–400)
RBC: 4.15 MIL/uL (ref 3.87–5.11)
RDW: 14 % (ref 11.5–15.5)
WBC: 9.2 10*3/uL (ref 4.0–10.5)
nRBC: 0 % (ref 0.0–0.2)

## 2023-04-22 LAB — BASIC METABOLIC PANEL
Anion gap: 10 (ref 5–15)
BUN: 47 mg/dL — ABNORMAL HIGH (ref 8–23)
CO2: 29 mmol/L (ref 22–32)
Calcium: 9 mg/dL (ref 8.9–10.3)
Chloride: 96 mmol/L — ABNORMAL LOW (ref 98–111)
Creatinine, Ser: 0.97 mg/dL (ref 0.44–1.00)
GFR, Estimated: 52 mL/min — ABNORMAL LOW (ref >60)
Glucose, Bld: 118 mg/dL — ABNORMAL HIGH (ref 70–99)
Potassium: 3.6 mmol/L (ref 3.5–5.1)
Sodium: 135 mmol/L (ref 135–145)

## 2023-04-22 MED ORDER — LIDOCAINE 5 % EX PTCH: 1.0000 | MEDICATED_PATCH | CUTANEOUS | Status: AC

## 2023-04-22 MED ORDER — OXYCODONE HCL 5 MG PO TABS: 2.5000 mg | ORAL_TABLET | ORAL | 0 refills | Status: AC | PRN

## 2023-04-22 MED ORDER — POLYETHYLENE GLYCOL 3350 17 G PO PACK: 17.0000 g | PACK | Freq: Every day | ORAL | Status: AC

## 2023-04-22 MED ORDER — CALCITONIN (SALMON) 200 UNIT/ACT NA SOLN: 1.0000 | Freq: Every day | NASAL | Status: AC

## 2023-04-22 MED ORDER — BISACODYL 5 MG PO TBEC: 5.0000 mg | DELAYED_RELEASE_TABLET | Freq: Every day | ORAL | Status: AC | PRN

## 2023-04-22 MED ORDER — DOCUSATE SODIUM 100 MG PO CAPS: 100.0000 mg | ORAL_CAPSULE | Freq: Two times a day (BID) | ORAL | Status: AC

## 2023-04-22 NOTE — TOC Progression Note (Signed)
Transition of Care [facility]) - Progression Note    Patient Details  Name: Erika Daniel MRN: [id number] Date of Birth: [birth year]  [facility]) CM/SW Contact  [name], RN Phone Number: 04/22/2023, 11:42 AM  Clinical Narrative:     Reviewed the bed offers with the patient and her daughter Sedalia Muta in the room, [place] in unavailable, they chose [place], Ins auth pending  Expected Discharge Plan: Skilled Nursing Facility Barriers to Discharge: SNF Pending bed offer  Expected Discharge Plan and Services   Discharge Planning Services: CM Consult   Living arrangements for the past 2 months: Single Family Home                 DME Arranged: N/A DME Agency: NA                   Social Determinants of Health (SDOH) Interventions SDOH Screenings   Food Insecurity: No Food Insecurity (04/19/2023)  Housing: Low Risk  (04/19/2023)  Transportation Needs: No Transportation Needs (03/04/2023)   Received from [facility]  Utilities: Not At Risk (04/19/2023)  Financial Resource Strain: Low Risk  (03/04/2023)   Received from [facility]  Tobacco Use: Low Risk  (04/21/2023)    Readmission Risk Interventions     No data to display

## 2023-04-22 NOTE — TOC Progression Note (Signed)
Transition of Care [facility]) - Progression Note    Patient Details  Name: Erika Daniel MRN: [id number] Date of Birth: 21-Apr-1922  Transition of [facility]) CM/SW Contact  [name], RN Phone Number: 04/22/2023, 1:59 PM  Clinical Narrative:     Ins Berkley Harvey approved [id number] 12/9-11 to go to [facility] and rehab   Expected Discharge Plan: Skilled Nursing Facility Barriers to Discharge: SNF Pending bed offer  Expected Discharge Plan and Services   Discharge Planning Services: CM Consult   Living arrangements for the past 2 months: Single Family Home                 DME Arranged: N/A DME Agency: NA                   Social Determinants of Health (SDOH) Interventions SDOH Screenings   Food Insecurity: No Food Insecurity (04/19/2023)  Housing: Low Risk  (04/19/2023)  Transportation Needs: No Transportation Needs (03/04/2023)   Received from [facility]  Utilities: Not At Risk (04/19/2023)  Financial Resource Strain: Low Risk  (03/04/2023)   Received from [facility]  Tobacco Use: Low Risk  (04/21/2023)    Readmission Risk Interventions     No data to display

## 2023-04-22 NOTE — Plan of Care (Signed)

## 2023-04-22 NOTE — TOC Progression Note (Signed)
Transition of Care [facility]) - Progression Note    Patient Details  Name: LAVONE SCHNITZER MRN: [id number] Date of Birth: 1922/01/09  [facility]) CM/SW Contact  [name], RN Phone Number: 04/22/2023, 3:34 PM  Clinical Narrative:     Going to [facility] and Rehab room [phone] EMS called to transport Diane in the room and aware  She is number 7 on the list  Expected Discharge Plan: Skilled Nursing Facility Barriers to Discharge: SNF Pending bed offer  Expected Discharge Plan and Services   Discharge Planning Services: CM Consult   Living arrangements for the past 2 months: Single Family Home Expected Discharge Date: 04/22/23               DME Arranged: N/A DME Agency: NA                   Social Determinants of Health (SDOH) Interventions SDOH Screenings   Food Insecurity: No Food Insecurity (04/19/2023)  Housing: Low Risk  (04/19/2023)  Transportation Needs: No Transportation Needs (03/04/2023)   Received [facility]  Utilities: Not At Risk (04/19/2023)  Financial Resource Strain: Low Risk  (03/04/2023)   Received from [facility]  Tobacco Use: Low Risk  (04/21/2023)    Readmission Risk Interventions     No data to display

## 2023-04-22 NOTE — Progress Notes (Signed)
Physical Therapy Treatment Patient Details Name: Erika Daniel MRN: [id number] DOB: [birth year] Today's Date: 04/22/2023   History of Present Illness Pt is a 89 y.o. female presenting to hospital 04/17/23 with c/o fall.  Pt admitted with fall at home.  Imaging showing acute fx T10 vertebral body; severe compression fx T12; possible fx L lateral 6th rib.  PMH includes htn, T12 fx, AKI, basal cell carcinoma.    PT Comments  Pt in chair, ready for session.  TLSO on.  She is able to stand with min a x 1 and walk with slow unsteady steps with min a to navigate with walker.  She is able to void with increased time then walk back where she opts to return to bed.  Much better pain control and activity tolerance this session.   If plan is discharge home, recommend the following: Assistance with cooking/housework;Assist for transportation;Help with stairs or ramp for entrance;A lot of help with walking and/or transfers;A lot of help with bathing/dressing/bathroom   Can travel by private vehicle        Equipment Recommendations  Rolling walker (2 wheels);BSC/3in1    Recommendations for Other Services       Precautions / Restrictions Precautions Precautions: Fall Precaution Comments: TLSO on when OOB Required Braces or Orthoses: Spinal Brace Spinal Brace: Thoracolumbosacral orthotic Restrictions Weight Bearing Restrictions: No     Mobility  Bed Mobility Overal bed mobility: Needs Assistance         Sit to supine: Mod assist   General bed mobility comments: for LE"s Patient Response: Cooperative  Transfers Overall transfer level: Needs assistance Equipment used: Rolling walker (2 wheels) Transfers: Sit to/from Stand, Bed to chair/wheelchair/BSC Sit to Stand: Min assist                Ambulation/Gait Ambulation/Gait assistance: Min assist Gait Distance (Feet): 30 Feet Assistive device: Rolling walker (2 wheels) Gait Pattern/deviations: Step-through pattern, Decreased step  length - right, Decreased step length - left, Trunk flexed Gait velocity: decreased     General Gait Details: 30' x 2   Stairs             Wheelchair Mobility     Tilt Bed Tilt Bed Patient Response: Cooperative  Modified Rankin (Stroke Patients Only)       Balance Overall balance assessment: Needs assistance Sitting-balance support: No upper extremity supported, Feet supported Sitting balance-Leahy Scale: Good     Standing balance support: Reliant on assistive device for balance, During functional activity, Single extremity supported Standing balance-Leahy Scale: Fair                              Cognition Arousal: Alert Behavior During Therapy: WFL for tasks assessed/performed Overall Cognitive Status: Within Functional Limits for tasks assessed                                          Exercises Other Exercises Other Exercises: to [facility] to void    General Comments        Pertinent Vitals/Pain Pain Assessment Pain Assessment: Faces Faces Pain Scale: Hurts little more Pain Location: much better pain control today and ability to tolerate activity Pain Descriptors / Indicators: Grimacing, Guarding, Crying Pain Intervention(s): Limited activity within patient's tolerance, Monitored during session, Patient requesting pain meds-RN notified, Repositioned    Home Living  Prior Function            PT Goals (current goals can now be found in the care plan section) Progress towards PT goals: Progressing toward goals    Frequency    Min 1X/week      PT Plan      Co-evaluation              AM-PAC PT "6 Clicks" Mobility   Outcome Measure  Help needed turning from your back to your side while in a flat bed without using bedrails?: A Lot Help needed moving from lying on your back to sitting on the side of a flat bed without using bedrails?: A Lot Help needed moving to and from a  bed to a chair (including a wheelchair)?: A Little Help needed standing up from a chair using your arms (e.g., wheelchair or bedside chair)?: A Little Help needed to walk in hospital room?: A Little Help needed climbing 3-5 steps with a railing? : Total 6 Click Score: 14    End of Session Equipment Utilized During Treatment: Gait belt;Back brace Activity Tolerance: Patient limited by pain Patient left: in bed;with call bell/phone within reach;with bed alarm set Nurse Communication: Mobility status;Precautions PT Visit Diagnosis: Unsteadiness on feet (R26.81);Other abnormalities of gait and mobility (R26.89);Muscle weakness (generalized) (M62.81);History of falling (Z91.81)     Time: 1610-9604 PT Time Calculation (min) (ACUTE ONLY): 17 min  Charges:    $Gait Training: 8-22 mins PT General Charges $$ ACUTE PT VISIT: 1 Visit                   Danielle Dess, PTA 04/22/23, 2:47 PM

## 2023-04-22 NOTE — Discharge Summary (Addendum)
Physician Discharge Summary   Patient: Erika Daniel MRN: [id number] DOB: 05-03-1922  Admit date:     04/17/2023  Discharge date: 04/22/23  Discharge Physician: Jonah Blue   PCP: Marisue Ivan, MD   Recommendations at discharge:   You are being discharged to [facility] for ongoing rehabilitation Wear TLSO brace when out of bed Follow up with neurosurgery Follow up with Dr. Burnadette Pop after discharge from rehab  Discharge Diagnoses: Principal Problem:   Fall at home, initial encounter Active Problems:   Benign essential HTN   Closed T12 fracture (HCC)   CKD (chronic kidney disease) stage 3, GFR 30-59 ml/min (HCC)   GERD without esophagitis   Compression fracture of body of thoracic vertebra [facility])    Hospital Course: 89yo with h/o HTN and GERD who presented on 12/4 with a fall, resulting in a T10 complete burst fracture (also has an old anterior wedge compression fracture of T12 with near complete loss of height anteriorly and centrally).  Neurosurgery is consulting, unlikely to be a surgical candidate.  Plan for pain control, PT/OT consults, and likely rehab placement.  Assessment and Plan: Fall at home, initial encounter Mechanical fall at home Patient lives alone Noted recurrent back pain with acute T10 burst fracture and chronic T12 compression fracture on imaging Pain control PT/OT evaluation -> SNF rehab Discussed with Dr. Myer Haff from neurosurgery - likely not surgical candidate, recommend TLSO Fall precautions Treating with lidoderm patch, calcitonin nasal spray, oxycodone as needed for pain   Benign essential HTN Continue home losartan, verapamil   Electrolyte abnormalities Mild hyponatremia, resolved Mild hypokalemia, resolved   Stage 3a CKD Appears to be stable at this time Attempt to avoid nephrotoxic medications   GERD without esophagitis Continue pantoprazole   Agitation Patient is 89 and lives independently She has had some  agitation MCI at baseline Delirium precautions  May need transition to ALF/memory care unit post-rehab   Constipation Continue bowel regimen  DNR Discussed with POA, DNR form completed      Consultants: Neurosurgery PT OT [facility] team   Procedures: None   Antibiotics: None      Pain control - [place] Controlled Substance Reporting System database was reviewed. and patient was instructed, not to drive, operate heavy machinery, perform activities at heights, swimming or participation in water activities or provide baby-sitting services while on Pain, Sleep and Anxiety Medications; until their outpatient Physician has advised to do so again. Also recommended to not to take more than prescribed Pain, Sleep and Anxiety Medications.    Disposition: Skilled nursing facility Diet recommendation:  Regular diet DISCHARGE MEDICATION: Allergies as of 04/22/2023       Reactions   Bisphosphonates    Other reaction(s): Other (See Comments), Other (See Comments) Intolerance Intolerance   Codeine Other (See Comments)   Nervous feeling        Medication List     TAKE these medications    acetaminophen 500 MG tablet Commonly known as: TYLENOL Take 500 mg by mouth every 6 (six) hours as needed for mild pain or headache.   Biotin 5000 MCG Tabs Take 5,000 mcg by mouth daily.   bisacodyl 5 MG EC tablet Commonly known as: DULCOLAX Take 1 tablet (5 mg total) by mouth daily as needed for moderate constipation.   calcitonin (salmon) 200 UNIT/ACT nasal spray Commonly known as: MIACALCIN/FORTICAL Place 1 spray into alternate nostrils daily. Start taking on: April 23, 2023   docusate sodium 100 MG capsule Commonly known as: COLACE  Take 1 capsule (100 mg total) by mouth 2 (two) times daily.   furosemide 20 MG tablet Commonly known as: LASIX Take 1 tablet (20 mg total) by mouth daily as needed (leg swelling).   lidocaine 5 % Commonly known as: LIDODERM Place 1 patch  onto the skin daily. Remove & Discard patch within 12 hours or as directed by MD Start taking on: April 23, 2023   losartan 50 MG tablet Commonly known as: COZAAR Take 1 tablet by mouth daily.   multivitamin with minerals Tabs tablet Take 1 tablet by mouth daily.   oxybutynin 5 MG 24 hr tablet Commonly known as: DITROPAN-XL Take 5 mg by mouth at bedtime.   oxyCODONE 5 MG immediate release tablet Commonly known as: Oxy IR/ROXICODONE Take 0.5-1 tablets (2.5-5 mg total) by mouth every 4 (four) hours as needed for moderate pain (pain score 4-6) or severe pain (pain score 7-10).   pantoprazole 40 MG tablet Commonly known as: PROTONIX Take 40 mg by mouth daily.   polyethylene glycol 17 g packet Commonly known as: MIRALAX / GLYCOLAX Take 17 g by mouth daily. Start taking on: April 23, 2023   potassium chloride SA 20 MEQ tablet Commonly known as: KLOR-CON M Take 20 mEq by mouth daily as needed (When taking Furosemide).   verapamil 240 MG CR tablet Commonly known as: CALAN-SR Take 240 mg by mouth daily.        Contact information for follow-up providers     Joan Flores, PA-C Follow up on 04/29/2023.   Specialty: Physician Assistant Why: Fracture follow up Contact information: [address] [place] [address] [phone]              Contact information for after-discharge care     Destination     [facility] Preferred SNF .   Service: Skilled Nursing Contact information: [address] [place] [address] [phone]                    Discharge Exam:   Subjective: Feeling better today, sitting up in the chair without concerns.   Objective: Vitals:   04/22/23 0921 04/22/23 0951  BP: (!) 121/51   Pulse: 82   Resp: 16 16  Temp: 97.7 F (36.5 C)   SpO2: 95%     Intake/Output Summary (Last 24 hours) at 04/22/2023 1412 Last data filed at 04/22/2023 0600 Gross  per 24 hour  Intake 240 ml  Output 450 ml  Net -210 ml   Filed Weights   04/17/23 1015 04/17/23 2300  Weight: 59.1 kg 57.5 kg    Exam:  General:  Appears calm and comfortable and is in NAD Eyes:  EOMI, normal lids, iris ENT:  grossly normal hearing, lips & tongue, mmm Neck:  no LAD, masses or thyromegaly Cardiovascular:  RRR, no m/r/g. No LE edema.  Respiratory:   CTA bilaterally with no wheezes/rales/rhonchi.  Normal respiratory effort. Abdomen:  soft, NT, ND Skin:  no rash or induration seen on limited exam Musculoskeletal:  grossly normal tone BUE/BLE, no bony abnormality Psychiatric:  blunted mood and affect, speech fluent and appropriate Neurologic:  CN 2-12 grossly intact, moves all extremities in coordinated fashion  Data Reviewed: I have reviewed the patient's lab results since admission.  Pertinent labs for today include:   Glucose 118 BUN 47/Creatinine 0.97/GFR 52 WBC 9.3 Platelets 530    Condition at discharge: stable  The results of significant diagnostics from  this hospitalization (including imaging, microbiology, ancillary and laboratory) are listed below for reference.   Imaging Studies: MR THORACIC SPINE WO CONTRAST  Result Date: 04/17/2023 CLINICAL DATA:  Mid back pain. History of compression fracture. Fell. Unable to get up. EXAM: MRI THORACIC SPINE WITHOUT CONTRAST TECHNIQUE: Multiplanar, multisequence MR imaging of the thoracic spine was performed. No intravenous contrast was administered. COMPARISON:  CT same day.  Radiography 09/22/2017. FINDINGS: Alignment: No traumatic malalignment. Chronic kyphotic deformity at the T12 level due to the old healed fracture. Vertebrae: Old healed anterior wedge compression fracture of T12. Near complete loss of height anteriorly and centrally. Loss of height of 25% at the posterior aspect of the vertebral body with mild posterior bowing but no ongoing neural compression. Acute fracture of the T10 vertebral body.  Utilizing the AO spine classification system, I think this is probably an A4 fracture, a complete burst fracture. Mild posterior bowing of the posterior margin of the vertebral body, no more than 2 mm. One could question very minimal edema in the pedicles, but this is not conclusive. I do not think there is definite disruption of the posterior tension band. No evidence of inter spinous ligament injury or ligamentum flavum disruption. Very small amount of epidural blood in the ventral epidural space without compressive effect upon the cord. No other thoracic region fracture. Cord:  No cord compression or evidence of cord injury. Paraspinal and other soft tissues: Mild edematous change in the region of T10. Disc levels: No significant disc level finding. No disc herniation or compressive narrowing of the canal or foramina otherwise. IMPRESSION: 1. Acute fracture of the T10 vertebral body. Utilizing the AO spine classification system, I think this is probably an AO fracture, a complete burst fracture. Mild posterior bowing of the posterior margin of the vertebral body, no more than 2 mm. One could question very minimal edema in the pedicles, but this is not conclusive. I do not think there is visible disruption of the posterior tension band. No evidence of inter spinous ligament injury or ligamentum flavum disruption. Very small amount of epidural blood in the ventral epidural space without compressive effect upon the cord. 2. Old healed anterior wedge compression fracture of T12. Near complete loss of height anteriorly and centrally. Loss of height of 25% at the posterior aspect of the vertebral body with mild posterior bowing but no ongoing neural compression. Chronic kyphotic deformity at the T12 level. Electronically Signed   By: Paulina Fusi M.D.   On: 04/17/2023 18:17   CT Lumbar Spine Wo Contrast  Result Date: 04/17/2023 CLINICAL DATA:  Larey Seat at home.  Back pain. EXAM: CT THORACIC AND LUMBAR SPINE WITHOUT  CONTRAST TECHNIQUE: Multidetector CT imaging of the thoracic and lumbar spine was performed without contrast. Multiplanar CT image reconstructions were also generated. RADIATION DOSE REDUCTION: This exam was performed according to the departmental dose-optimization program which includes automated exposure control, adjustment of the mA and/or kV according to patient size and/or use of iterative reconstruction technique. COMPARISON:  Thoracic spine radiographs, 09/22/2017. FINDINGS: CT THORACIC SPINE FINDINGS Alignment: Normal. Vertebrae: Severe compression fracture of T12, increased in severity from the radiographs from 09/22/2017. No other fractures. No bone lesions. Paraspinal and other soft tissues: No paraspinal mass, edema or hematoma. No acute findings in the visualized lungs. Aortic atherosclerosis and 3 vessel coronary artery calcifications. Disc levels: Disc spaces are relatively well preserved. There are small endplate osteophytes from the mid through the lower thoracic spine. No convincing disc herniation. No significant  disc bulging. Central spinal canal is well preserved. CT LUMBAR SPINE FINDINGS Segmentation: 5 lumbar type vertebrae. Alignment: Mild dextroscoliosis, apex at L1-L2. No spondylolisthesis. Vertebrae: No acute fracture or focal pathologic process. Paraspinal and other soft tissues: Aortic and branch vessel atherosclerotic calcifications. 2.8 cm right adrenal adenoma, Hounsfield units of 1. Fluid density mass consistent with an exophytic cyst along the anterior margin of the left kidney. No paraspinal mass, hematoma or inflammation. Disc levels: T12-L1: Unremarkable. L1-L2: Unremarkable. L2-L3: Minor disc bulging. No disc herniation. No significant stenosis. L3-L4: Mild disc bulging and ligamentum flavum enlargement. Mild neural foraminal narrowing. L4-L5: Mild diffuse disc bulging and ligamentum flavum enlargement. Mild narrowing of the central spinal canal and superolateral recesses. Mild  right neural foraminal narrowing. L5-S1: Unremarkable. IMPRESSION: CT THORACIC SPINE 1. Severe compression fracture of T12, increased in severity compared to the radiographs from 2019, but appearing chronic. 2. No other fractures.  No convincing acute finding. CT LUMBAR SPINE 1. No fracture or acute finding. 2. Degenerative changes as detailed. Electronically Signed   By: Amie Portland M.D.   On: 04/17/2023 11:33   CT Thoracic Spine Wo Contrast  Result Date: 04/17/2023 CLINICAL DATA:  Larey Seat at home.  Back pain. EXAM: CT THORACIC AND LUMBAR SPINE WITHOUT CONTRAST TECHNIQUE: Multidetector CT imaging of the thoracic and lumbar spine was performed without contrast. Multiplanar CT image reconstructions were also generated. RADIATION DOSE REDUCTION: This exam was performed according to the departmental dose-optimization program which includes automated exposure control, adjustment of the mA and/or kV according to patient size and/or use of iterative reconstruction technique. COMPARISON:  Thoracic spine radiographs, 09/22/2017. FINDINGS: CT THORACIC SPINE FINDINGS Alignment: Normal. Vertebrae: Severe compression fracture of T12, increased in severity from the radiographs from 09/22/2017. No other fractures. No bone lesions. Paraspinal and other soft tissues: No paraspinal mass, edema or hematoma. No acute findings in the visualized lungs. Aortic atherosclerosis and 3 vessel coronary artery calcifications. Disc levels: Disc spaces are relatively well preserved. There are small endplate osteophytes from the mid through the lower thoracic spine. No convincing disc herniation. No significant disc bulging. Central spinal canal is well preserved. CT LUMBAR SPINE FINDINGS Segmentation: 5 lumbar type vertebrae. Alignment: Mild dextroscoliosis, apex at L1-L2. No spondylolisthesis. Vertebrae: No acute fracture or focal pathologic process. Paraspinal and other soft tissues: Aortic and branch vessel atherosclerotic calcifications.  2.8 cm right adrenal adenoma, Hounsfield units of 1. Fluid density mass consistent with an exophytic cyst along the anterior margin of the left kidney. No paraspinal mass, hematoma or inflammation. Disc levels: T12-L1: Unremarkable. L1-L2: Unremarkable. L2-L3: Minor disc bulging. No disc herniation. No significant stenosis. L3-L4: Mild disc bulging and ligamentum flavum enlargement. Mild neural foraminal narrowing. L4-L5: Mild diffuse disc bulging and ligamentum flavum enlargement. Mild narrowing of the central spinal canal and superolateral recesses. Mild right neural foraminal narrowing. L5-S1: Unremarkable. IMPRESSION: CT THORACIC SPINE 1. Severe compression fracture of T12, increased in severity compared to the radiographs from 2019, but appearing chronic. 2. No other fractures.  No convincing acute finding. CT LUMBAR SPINE 1. No fracture or acute finding. 2. Degenerative changes as detailed. Electronically Signed   By: Amie Portland M.D.   On: 04/17/2023 11:33   DG Chest 2 View  Result Date: 04/17/2023 CLINICAL DATA:  Fall. EXAM: CHEST - 2 VIEW COMPARISON:  Chest radiograph dated January 07, 2015. Thoracic spine radiograph dated Sep 22, 2017. FINDINGS: The heart size and mediastinal contours are within normal limits. Chronic appearing basilar predominant interstitial changes. No focal  consolidation. No pleural effusion or pneumothorax. Age-indeterminate severe compression deformity of the T12 vertebral body, progressed since the prior exam dated Sep 22, 2017. Subtle cortical irregularity of the left lateral sixth rib. IMPRESSION: 1. Age-indeterminate severe compression deformity of the T12 vertebral body, progressed since the prior exam dated Sep 22, 2017. Recommend correlation with history and physical exam. 2. Subtle cortical irregularity of the left lateral sixth rib is concerning for possible fracture. Further evaluation with CT chest could be obtained. 3. No acute cardiopulmonary findings.  Electronically Signed   By: Hart Robinsons M.D.   On: 04/17/2023 11:23   CT HEAD WO CONTRAST ( )  Result Date: 04/17/2023 CLINICAL DATA:  Larey Seat at home. EXAM: CT HEAD WITHOUT CONTRAST CT CERVICAL SPINE WITHOUT CONTRAST TECHNIQUE: Multidetector CT imaging of the head and cervical spine was performed following the standard protocol without intravenous contrast. Multiplanar CT image reconstructions of the cervical spine were also generated. RADIATION DOSE REDUCTION: This exam was performed according to the departmental dose-optimization program which includes automated exposure control, adjustment of the mA and/or kV according to patient size and/or use of iterative reconstruction technique. COMPARISON:  11/09/2018. FINDINGS: CT HEAD FINDINGS Brain: No evidence of acute infarction, hemorrhage, hydrocephalus, extra-axial collection or mass lesion/mass effect. There is ventricular and sulcal enlargement consistent with age appropriate volume loss. Patchy areas of white matter hypoattenuation are noted consistent with moderate chronic microvascular ischemic change. This is stable. Vascular: No hyperdense vessel or unexpected calcification. Skull: Normal. Negative for fracture or focal lesion. Sinuses/Orbits: Globes and orbits are unremarkable. Sinuses are clear. Other: None. CT CERVICAL SPINE FINDINGS Alignment: Normal. Skull base and vertebrae: No acute fracture. No primary bone lesion or focal pathologic process. Soft tissues and spinal canal: No prevertebral fluid or swelling. No visible canal hematoma. Disc levels: Disc spaces well maintained. No convincing disc herniation. Mild disc bulging at C6-C7. No significant stenosis. Facet degenerative changes noted on the left most prominently at C3-C4. Upper chest: No acute findings. Other: None. IMPRESSION: CT HEAD: 1. No acute intracranial abnormalities. 2. Chronic microvascular ischemic change of the white matter. CT CERVICAL SPINE: No fracture or acute finding.  Electronically Signed   By: Amie Portland M.D.   On: 04/17/2023 11:21   CT Cervical Spine Wo Contrast  Result Date: 04/17/2023 CLINICAL DATA:  Larey Seat at home. EXAM: CT HEAD WITHOUT CONTRAST CT CERVICAL SPINE WITHOUT CONTRAST TECHNIQUE: Multidetector CT imaging of the head and cervical spine was performed following the standard protocol without intravenous contrast. Multiplanar CT image reconstructions of the cervical spine were also generated. RADIATION DOSE REDUCTION: This exam was performed according to the departmental dose-optimization program which includes automated exposure control, adjustment of the mA and/or kV according to patient size and/or use of iterative reconstruction technique. COMPARISON:  11/09/2018. FINDINGS: CT HEAD FINDINGS Brain: No evidence of acute infarction, hemorrhage, hydrocephalus, extra-axial collection or mass lesion/mass effect. There is ventricular and sulcal enlargement consistent with age appropriate volume loss. Patchy areas of white matter hypoattenuation are noted consistent with moderate chronic microvascular ischemic change. This is stable. Vascular: No hyperdense vessel or unexpected calcification. Skull: Normal. Negative for fracture or focal lesion. Sinuses/Orbits: Globes and orbits are unremarkable. Sinuses are clear. Other: None. CT CERVICAL SPINE FINDINGS Alignment: Normal. Skull base and vertebrae: No acute fracture. No primary bone lesion or focal pathologic process. Soft tissues and spinal canal: No prevertebral fluid or swelling. No visible canal hematoma. Disc levels: Disc spaces well maintained. No convincing disc herniation. Mild disc bulging at C6-C7.  No significant stenosis. Facet degenerative changes noted on the left most prominently at C3-C4. Upper chest: No acute findings. Other: None. IMPRESSION: CT HEAD: 1. No acute intracranial abnormalities. 2. Chronic microvascular ischemic change of the white matter. CT CERVICAL SPINE: No fracture or acute finding.  Electronically Signed   By: Amie Portland M.D.   On: 04/17/2023 11:21    Microbiology: Results for orders placed or performed during the hospital encounter of 07/16/21  Resp Panel by RT-PCR (Flu A&B, Covid) Nasopharyngeal Swab     Status: None   Collection Time: 07/17/21 12:58 AM   Specimen: Nasopharyngeal Swab; Nasopharyngeal(NP) swabs in vial transport medium  Result Value Ref Range Status   SARS Coronavirus 2 by RT PCR NEGATIVE NEGATIVE Final    Comment: (NOTE) SARS-CoV-2 target nucleic acids are NOT DETECTED.  The SARS-CoV-2 RNA is generally detectable in upper respiratory specimens during the acute phase of infection. The lowest concentration of SARS-CoV-2 viral copies this assay can detect is 138 copies/mL. A negative result does not preclude SARS-Cov-2 infection and should not be used as the sole basis for treatment or other patient management decisions. A negative result may occur with  improper specimen collection/handling, submission of specimen other than nasopharyngeal swab, presence of viral mutation(s) within the areas targeted by this assay, and inadequate number of viral copies(<138 copies/mL). A negative result must be combined with clinical observations, patient history, and epidemiological information. The expected result is Negative.  Fact Sheet for Patients:  BloggerCourse.com  Fact Sheet for Healthcare Providers:  SeriousBroker.it  This test is no t yet approved or cleared by the [place] FDA and  has been authorized for detection and/or diagnosis of SARS-CoV-2 by FDA under an Emergency Use Authorization (EUA). This EUA will remain  in effect (meaning this test can be used) for the duration of the COVID-19 declaration under Section 564(b)(1) of the Act, 21 U.S.C.section 360bbb-3(b)(1), unless the authorization is terminated  or revoked sooner.       Influenza A by PCR NEGATIVE NEGATIVE Final    Influenza B by PCR NEGATIVE NEGATIVE Final    Comment: (NOTE) The Xpert Xpress SARS-CoV-2/FLU/RSV plus assay is intended as an aid in the diagnosis of influenza from Nasopharyngeal swab specimens and should not be used as a sole basis for treatment. Nasal washings and aspirates are unacceptable for Xpert Xpress SARS-CoV-2/FLU/RSV testing.  Fact Sheet for Patients: BloggerCourse.com  Fact Sheet for Healthcare Providers: SeriousBroker.it  This test is not yet approved or cleared by the [place] FDA and has been authorized for detection and/or diagnosis of SARS-CoV-2 by FDA under an Emergency Use Authorization (EUA). This EUA will remain in effect (meaning this test can be used) for the duration of the COVID-19 declaration under Section 564(b)(1) of the Act, 21 U.S.C. section 360bbb-3(b)(1), unless the authorization is terminated or revoked.  Performed at [facility], [address] [place], [place] [address]     Labs: CBC: Recent Labs  Lab 04/17/23 1023 04/18/23 0404 04/20/23 0306 04/21/23 0403 04/22/23 0503  WBC 15.4* 11.4* 14.2* 10.2 9.2  NEUTROABS 13.0*  --  11.6* 7.7 6.9  HGB 14.1 12.9 13.7 12.5 12.8  HCT 42.1 37.9 40.2 37.0 38.1  MCV 91.9 90.0 90.5 91.4 91.8  PLT 569* 540* 609* 531* 530*   Basic Metabolic Panel: Recent Labs  Lab 04/17/23 1023 04/18/23 0404 04/20/23 0306 04/21/23 0403 04/22/23 0503  NA 135 133* 133* 131* 135  K 4.0 3.5 3.3* 3.4* 3.6  CL 100  96* 97* 94* 96*  CO2 28 27 29 26 29   GLUCOSE 109* 114* 144* 107* 118*  BUN 14 19 31* 39* 47*  CREATININE 0.79 0.95 0.95 1.15* 0.97  CALCIUM 9.4 9.0 9.0 8.7* 9.0   Liver Function Tests: Recent Labs  Lab 04/18/23 0404  AST 24  ALT 19  ALKPHOS 51  BILITOT 1.1  PROT 5.9*  ALBUMIN 3.6   CBG: No results for input(s): "GLUCAP" in the last 168 hours.  Discharge time spent: greater than 30 minutes.  Signed: Jonah Blue,  MD [facility] 04/22/2023

## 2023-04-26 ENCOUNTER — Other Ambulatory Visit: Payer: Self-pay

## 2023-04-26 DIAGNOSIS — S22080S Wedge compression fracture of T11-T12 vertebra, sequela: Secondary | ICD-10-CM

## 2023-04-29 ENCOUNTER — Ambulatory Visit
Admission: RE | Admit: 2023-04-29 | Discharge: 2023-04-29 | Disposition: A | Discharge status: Home / Self Care | Payer: Medicare Other | Source: Ambulatory Visit | Attending: Physician Assistant | Admitting: Physician Assistant

## 2023-04-29 ENCOUNTER — Ambulatory Visit: Payer: Medicare Other | Admitting: Physician Assistant

## 2023-04-29 ENCOUNTER — Ambulatory Visit
Admission: RE | Admit: 2023-04-29 | Discharge: 2023-04-29 | Disposition: A | Discharge status: Home / Self Care | Payer: Medicare Other | Attending: Physician Assistant | Admitting: Physician Assistant

## 2023-04-29 VITALS — BP 138/82 | Ht 63.0 in | Wt 126.0 lb

## 2023-04-29 DIAGNOSIS — S22080S Wedge compression fracture of T11-T12 vertebra, sequela: Secondary | ICD-10-CM | POA: Insufficient documentation

## 2023-04-29 DIAGNOSIS — S22080D Wedge compression fracture of T11-T12 vertebra, subsequent encounter for fracture with routine healing: Secondary | ICD-10-CM

## 2023-04-29 DIAGNOSIS — W19XXXD Unspecified fall, subsequent encounter: Secondary | ICD-10-CM | POA: Diagnosis not present

## 2023-04-29 DIAGNOSIS — S22070D Wedge compression fracture of T9-T10 vertebra, subsequent encounter for fracture with routine healing: Secondary | ICD-10-CM

## 2023-04-29 NOTE — Progress Notes (Signed)
Referring Physician:  Marisue Ivan, MD [phone] [facility] [address] [facility] [place],  [place] [address]  Primary Physician:  Marisue Ivan, MD  History of Present Illness: 04/29/2023 [name]  is a 89 y.o. female who fell on 04/17/2023 at home and imaging showed an acute T10 fracture as well as a chronic severe compression fracture at T12 with total collapse.  She has a past medical history to include dementia, hypertension, AKI, basal cell carcinoma.  She states her back pain has improved since discharge and she has been wearing her TLSO brace diligently at her rehab facility.  She states she is having a very hard time with her brace and she would like to have it removed as soon as possible.  She adds that her pain is a 6/10.  No new weakness or complaints at this time. The symptoms are causing a significant impact on the patient's life.   Review of Systems:  A 10 point review of systems is negative, except for the pertinent positives and negatives detailed in the HPI.  Past Medical History: Past Medical History:  Diagnosis Date   AKI (acute kidney injury) (HCC) 07/17/2021   Basal cell carcinoma 12/31/2016   Left ear sup. helix. Nodular pattern.   Basal cell carcinoma 12/31/2017   Left lateral forehead above brow. Micronodular pattern.    Basal cell carcinoma 04/11/2020   recurrent bcc? Left lateral forehead above brow, excision   Basal cell carcinoma 10/18/2020   L upper ear helix, EDC   Bone loss    Hypertension    Hypokalemia 07/17/2021   Osteoarthrosis     Past Surgical History: Past Surgical History:  Procedure Laterality Date   EYE SURGERY Right    implant   TONSILLECTOMY      Allergies: Allergies as of 04/29/2023 - Review Complete 04/29/2023  Allergen Reaction Noted   Bisphosphonates  08/01/2013   Codeine Other (See Comments) 06/17/2013    Medications: Outpatient Encounter Medications as of 04/29/2023  Medication Sig   acetaminophen  (TYLENOL) 500 MG tablet Take 500 mg by mouth every 6 (six) hours as needed for mild pain or headache.   Biotin 5000 MCG TABS Take 5,000 mcg by mouth daily.   bisacodyl (DULCOLAX) 5 MG EC tablet Take 1 tablet (5 mg total) by mouth daily as needed for moderate constipation.   calcitonin, salmon, (MIACALCIN/FORTICAL) 200 UNIT/ACT nasal spray Place 1 spray into alternate nostrils daily.   docusate sodium (COLACE) 100 MG capsule Take 1 capsule (100 mg total) by mouth 2 (two) times daily.   furosemide (LASIX) 20 MG tablet Take 1 tablet (20 mg total) by mouth daily as needed (leg swelling).   lidocaine (LIDODERM) 5 % Place 1 patch onto the skin daily. Remove & Discard patch within 12 hours or as directed by MD   losartan (COZAAR) 50 MG tablet Take 1 tablet by mouth daily.   Multiple Vitamin (MULTIVITAMIN WITH MINERALS) TABS tablet Take 1 tablet by mouth daily.   oxybutynin (DITROPAN-XL) 5 MG 24 hr tablet Take 5 mg by mouth at bedtime.   oxyCODONE (OXY IR/ROXICODONE) 5 MG immediate release tablet Take 0.5-1 tablets (2.5-5 mg total) by mouth every 4 (four) hours as needed for moderate pain (pain score 4-6) or severe pain (pain score 7-10).   pantoprazole (PROTONIX) 40 MG tablet Take 40 mg by mouth daily.   polyethylene glycol (MIRALAX / GLYCOLAX) 17 g packet Take 17 g by mouth daily.   potassium chloride SA (KLOR-CON M)  20 MEQ tablet Take 20 mEq by mouth daily as needed (When taking Furosemide).   verapamil (CALAN-SR) 240 MG CR tablet Take 240 mg by mouth daily.   No facility-administered encounter medications on file as of 04/29/2023.    Social History: Social History   Tobacco Use   Smoking status: Never   Smokeless tobacco: Never  Substance Use Topics   Alcohol use: No   Drug use: Never    Family Medical History: Family History  Problem Relation Age of Onset   Heart disease Mother    Prostate cancer Father     Physical Examination:    NEUROLOGICAL:     Patient is awake and alert  but only oriented to self.  Patient is unable to indicate year, president, location.  Cranial Nerves: Pupils equal round and reactive to light.  Facial tone is symmetric.   ROM of spine: Limited range of motion of her spine.  Patient is tender to palpation of her thoracic spine.  Strength:  Examination limited, but is to baseline.  In the states 4-5/5 in bilateral lower extremities with the exception of dorsiflexion in bilateral feet at a 2/5  Patient is currently nonambulatory in a wheelchair  Medical Decision Making  Imaging: IMPRESSION: 1. Acute fracture of the T10 vertebral body. Utilizing the AO spine classification system, I think this is probably an AO fracture, a complete burst fracture. Mild posterior bowing of the posterior margin of the vertebral body, no more than 2 mm. One could question very minimal edema in the pedicles, but this is not conclusive. I do not think there is visible disruption of the posterior tension band. No evidence of inter spinous ligament injury or ligamentum flavum disruption. Very small amount of epidural blood in the ventral epidural space without compressive effect upon the cord. 2. Old healed anterior wedge compression fracture of T12. Near complete loss of height anteriorly and centrally. Loss of height of 25% at the posterior aspect of the vertebral body with mild posterior bowing but no ongoing neural compression. Chronic kyphotic deformity at the T12 level.  I have personally reviewed the images and agree with the above interpretation.  Assessment and Plan: [name] is a pleasant 89 y.o. female s a 89 y.o. female who fell on 04/17/2023 at home and imaging showed an acute T10 fracture as well as a chronic severe compression fracture at T12 with total collapse.  She has a past medical history to include dementia, hypertension, AKI, basal cell carcinoma.She states her back pain has improved since discharge and she has been wearing her TLSO  brace diligently at her rehab facility.  She states she is having a very hard time with her brace and she would like to have it removed as soon as possible.  She adds that her pain is a 6/10.  No new weakness or complaints at this time.   It is a pleasure to see the patient in clinic today.  Thankfully her pain is improving.  Patient is very bothered by her TLSO brace.  Risks involving removal brace were discussed with the patient and her niece at length.  It is okay for patient to remove brace and only wear for comfort when she would like.  She is aware of risks.  In addition, therapy at patient's rehab facility could consider potential AFO braces to reduce fall risk due to bilateral dropfoot.  If patient's pain were to change or if she would like, would consider kyphoplasty when she is greater  than 6 weeks from date of initial injury.    Thank you for involving me in the care of this patient.    Joan Flores, PA-C Dept. of Neurosurgery

## 2023-05-20 ENCOUNTER — Ambulatory Visit: Payer: Medicare Other | Admitting: Podiatry

## 2023-06-15 DEATH — deceased
